# Patient Record
Sex: Female | Born: 1980 | Race: White | Hispanic: No | Marital: Married | Smoking: Never smoker
Health system: Southern US, Community
[De-identification: ages and names within clinical notes are randomized; demographics above are authoritative.]

## PROBLEM LIST (undated history)

## (undated) DIAGNOSIS — H8110 Benign paroxysmal vertigo, unspecified ear: Secondary | ICD-10-CM

## (undated) HISTORY — PX: UMBILICAL HERNIA REPAIR: SHX196

## (undated) HISTORY — DX: Benign paroxysmal vertigo, unspecified ear: H81.10

## (undated) HISTORY — PX: TONSILLECTOMY AND ADENOIDECTOMY: SUR1326

---

## 1999-08-31 ENCOUNTER — Emergency Department (HOSPITAL_COMMUNITY): Admission: EM | Admit: 1999-08-31 | Discharge: 1999-09-01 | Payer: Self-pay | Admitting: Emergency Medicine

## 2000-06-06 ENCOUNTER — Other Ambulatory Visit: Admission: RE | Admit: 2000-06-06 | Discharge: 2000-06-06 | Payer: Self-pay | Admitting: Obstetrics and Gynecology

## 2001-06-09 ENCOUNTER — Other Ambulatory Visit: Admission: RE | Admit: 2001-06-09 | Discharge: 2001-06-09 | Payer: Self-pay | Admitting: Obstetrics and Gynecology

## 2002-07-03 ENCOUNTER — Other Ambulatory Visit: Admission: RE | Admit: 2002-07-03 | Discharge: 2002-07-03 | Payer: Self-pay | Admitting: Obstetrics and Gynecology

## 2003-08-18 ENCOUNTER — Other Ambulatory Visit: Admission: RE | Admit: 2003-08-18 | Discharge: 2003-08-18 | Payer: Self-pay | Admitting: Obstetrics and Gynecology

## 2004-03-15 ENCOUNTER — Emergency Department (HOSPITAL_COMMUNITY): Admission: EM | Admit: 2004-03-15 | Discharge: 2004-03-16 | Payer: Self-pay | Admitting: Emergency Medicine

## 2004-09-12 ENCOUNTER — Other Ambulatory Visit: Admission: RE | Admit: 2004-09-12 | Discharge: 2004-09-12 | Payer: Self-pay | Admitting: Obstetrics and Gynecology

## 2004-11-10 ENCOUNTER — Ambulatory Visit: Payer: Self-pay | Admitting: Internal Medicine

## 2004-11-24 ENCOUNTER — Ambulatory Visit: Payer: Self-pay | Admitting: Internal Medicine

## 2005-12-27 ENCOUNTER — Ambulatory Visit: Payer: Self-pay | Admitting: Internal Medicine

## 2006-01-16 LAB — PULMONARY FUNCTION TEST

## 2006-01-29 ENCOUNTER — Encounter: Admission: RE | Admit: 2006-01-29 | Discharge: 2006-01-29 | Payer: Self-pay | Admitting: Allergy and Immunology

## 2006-10-23 ENCOUNTER — Telehealth (INDEPENDENT_AMBULATORY_CARE_PROVIDER_SITE_OTHER): Payer: Self-pay | Admitting: *Deleted

## 2006-10-25 ENCOUNTER — Ambulatory Visit: Payer: Self-pay | Admitting: Internal Medicine

## 2006-10-25 DIAGNOSIS — Z8611 Personal history of tuberculosis: Secondary | ICD-10-CM

## 2006-10-30 ENCOUNTER — Encounter: Payer: Self-pay | Admitting: Internal Medicine

## 2007-01-29 ENCOUNTER — Emergency Department (HOSPITAL_COMMUNITY): Admission: EM | Admit: 2007-01-29 | Discharge: 2007-01-30 | Payer: Self-pay | Admitting: Emergency Medicine

## 2007-07-04 ENCOUNTER — Ambulatory Visit (HOSPITAL_COMMUNITY): Admission: RE | Admit: 2007-07-04 | Discharge: 2007-07-04 | Payer: Self-pay | Admitting: Obstetrics and Gynecology

## 2007-09-15 ENCOUNTER — Ambulatory Visit: Payer: Self-pay | Admitting: Internal Medicine

## 2007-09-15 DIAGNOSIS — R1033 Periumbilical pain: Secondary | ICD-10-CM | POA: Insufficient documentation

## 2007-09-15 LAB — CONVERTED CEMR LAB
Beta hcg, urine, semiquantitative: NEGATIVE
Nitrite: NEGATIVE
Specific Gravity, Urine: 1.01
WBC Urine, dipstick: NEGATIVE
pH: 6

## 2007-09-18 ENCOUNTER — Ambulatory Visit: Payer: Self-pay | Admitting: Cardiology

## 2007-09-22 LAB — CONVERTED CEMR LAB
Basophils Relative: 0.3 % (ref 0.0–3.0)
Eosinophils Absolute: 0.1 10*3/uL (ref 0.0–0.7)
Eosinophils Relative: 0.7 % (ref 0.0–5.0)
MCHC: 33.9 g/dL (ref 30.0–36.0)
Monocytes Absolute: 0.5 10*3/uL (ref 0.1–1.0)
Monocytes Relative: 6.4 % (ref 3.0–12.0)
Neutro Abs: 4.8 10*3/uL (ref 1.4–7.7)
Platelets: 176 10*3/uL (ref 150–400)
RDW: 11.7 % (ref 11.5–14.6)
WBC: 7.8 10*3/uL (ref 4.5–10.5)

## 2007-09-23 ENCOUNTER — Telehealth (INDEPENDENT_AMBULATORY_CARE_PROVIDER_SITE_OTHER): Payer: Self-pay | Admitting: *Deleted

## 2007-11-11 ENCOUNTER — Telehealth: Payer: Self-pay | Admitting: Internal Medicine

## 2008-01-15 ENCOUNTER — Telehealth (INDEPENDENT_AMBULATORY_CARE_PROVIDER_SITE_OTHER): Payer: Self-pay | Admitting: *Deleted

## 2008-03-26 ENCOUNTER — Ambulatory Visit: Payer: Self-pay | Admitting: Family Medicine

## 2008-03-30 ENCOUNTER — Telehealth (INDEPENDENT_AMBULATORY_CARE_PROVIDER_SITE_OTHER): Payer: Self-pay | Admitting: *Deleted

## 2008-04-01 ENCOUNTER — Encounter: Payer: Self-pay | Admitting: Family Medicine

## 2008-09-09 ENCOUNTER — Ambulatory Visit: Payer: Self-pay | Admitting: Surgery

## 2008-09-09 ENCOUNTER — Ambulatory Visit (HOSPITAL_COMMUNITY): Admission: RE | Admit: 2008-09-09 | Discharge: 2008-09-09 | Payer: Self-pay | Admitting: Obstetrics and Gynecology

## 2008-09-09 ENCOUNTER — Encounter (HOSPITAL_COMMUNITY): Payer: Self-pay | Admitting: Obstetrics and Gynecology

## 2008-10-12 ENCOUNTER — Inpatient Hospital Stay (HOSPITAL_COMMUNITY): Admission: AD | Admit: 2008-10-12 | Discharge: 2008-10-12 | Payer: Self-pay | Admitting: Obstetrics and Gynecology

## 2008-10-13 ENCOUNTER — Inpatient Hospital Stay (HOSPITAL_COMMUNITY): Admission: AD | Admit: 2008-10-13 | Discharge: 2008-10-15 | Payer: Self-pay | Admitting: Obstetrics and Gynecology

## 2009-09-14 ENCOUNTER — Ambulatory Visit: Payer: Self-pay | Admitting: Internal Medicine

## 2009-09-14 DIAGNOSIS — R42 Dizziness and giddiness: Secondary | ICD-10-CM | POA: Insufficient documentation

## 2009-12-06 ENCOUNTER — Ambulatory Visit: Payer: Self-pay | Admitting: Internal Medicine

## 2009-12-06 DIAGNOSIS — H8110 Benign paroxysmal vertigo, unspecified ear: Secondary | ICD-10-CM

## 2009-12-20 ENCOUNTER — Encounter
Admission: RE | Admit: 2009-12-20 | Discharge: 2010-02-15 | Payer: Self-pay | Source: Home / Self Care | Attending: Internal Medicine | Admitting: Internal Medicine

## 2009-12-27 ENCOUNTER — Encounter: Payer: Self-pay | Admitting: Internal Medicine

## 2010-02-06 ENCOUNTER — Telehealth (INDEPENDENT_AMBULATORY_CARE_PROVIDER_SITE_OTHER): Payer: Self-pay | Admitting: *Deleted

## 2010-03-19 ENCOUNTER — Encounter: Payer: Self-pay | Admitting: Obstetrics and Gynecology

## 2010-03-27 ENCOUNTER — Ambulatory Visit
Admission: RE | Admit: 2010-03-27 | Discharge: 2010-03-27 | Payer: Self-pay | Source: Home / Self Care | Attending: Internal Medicine | Admitting: Internal Medicine

## 2010-03-27 DIAGNOSIS — H5500 Unspecified nystagmus: Secondary | ICD-10-CM | POA: Insufficient documentation

## 2010-03-27 DIAGNOSIS — K219 Gastro-esophageal reflux disease without esophagitis: Secondary | ICD-10-CM | POA: Insufficient documentation

## 2010-03-28 NOTE — Miscellaneous (Signed)
Summary: PT Initial Summary/[facility]  PT Initial [facility]   Imported By: Lanelle Bal 01/05/2010 14:02:32  _____________________________________________________________________  External Attachment:    Type:   Image     Comment:   External Document

## 2010-03-28 NOTE — Assessment & Plan Note (Signed)
Summary: VERTIGO IN JULY, THEN FLARED LAST WEEK///SPH   Vital Signs:  Patient profile:   30 year old female Height:      66 inches Weight:      148.8 pounds BMI:     24.10 Temp:     98.4 degrees F oral Pulse rate:   72 / minute Resp:     14 per minute BP sitting:   110 / 68  (left arm) Cuff size:   regular  Vitals Entered By: Shonna Chock CMA (December 06, 2009 4:12 PM) CC: Re-occuring vertigo, Syncope   CC:  Re-occuring vertigo and Syncope.  History of Present Illness:      This is a 30 year old woman who presents with dizziness &  frank vertigo in 08/2009 for 3-4 weeks & now  from 10/02 - now .  The patient denies loss of consciousness, premonitory symptoms, near loss of consciousness, palpitations, and lightheadedness.  The patient denies the following symptoms: headache, abdominal discomfort, nausea, vomiting, pallor, diaphoresis, focal weakness, blurred vision, and perioral numbness.  The patient reports the following precipitating factors: change in position in bed , not definitely either LDP.  The  verigo always has occurred in the setting of changing position in bed. She went to Web [employer] ; exercises did not help.   Allergies (verified): No Known Drug Allergies  Review of Systems ENT:  Denies decreased hearing, nasal congestion, and ringing in ears.  Physical Exam  General:  well-nourished,in no acute distress; alert,appropriate and cooperative throughout examination Eyes:  No corneal or conjunctival inflammation noted. EOMI. Perrla. Field of  Vision grossly normal. Minimal L lateral gaze nystagmus; no vertigo with positioning Ears:  External ear exam shows no significant lesions or deformities.  Otoscopic examination reveals clear canals, tympanic membranes are intact bilaterally without bulging, retraction, inflammation or discharge. Hearing is grossly normal bilaterally.Tuning fork exam WNL Mouth:  Oral mucosa and oropharynx without lesions or exudates.  Teeth in good repair  No tongue deviation. Neurologic:  alert & oriented X3, cranial nerves II-XII intact, sensation intact to light touch, gait normal, DTRs symmetrical and normal, finger-to-nose normal, and Romberg negative.   Skin:  Intact without suspicious lesions or rashes Cervical Nodes:  No lymphadenopathy noted Axillary Nodes:  No palpable lymphadenopathy Psych:  memory intact for recent and remote, normally interactive, and good eye contact.     Impression & Recommendations:  Problem # 1:  BENIGN POSITIONAL VERTIGO (ICD-386.11)  Orders: Physical Therapy Referral (PT)  Her updated medication list for this problem includes:    Meclizine Hcl 25 Mg Tabs (Meclizine hcl) .Marland Kitchen... 1 every 6 hrs as needed for vertigo  Complete Medication List: 1)  Meclizine Hcl 25 Mg Tabs (Meclizine hcl) .Marland Kitchen.. 1 every 6 hrs as needed for vertigo  Patient Instructions: 1)  Limit your Sodium (Salt) to less than 4 grams a day (slightly less than 1 teaspoon) to prevent fluid retention, swelling, or worsening or symptoms. Prescriptions: MECLIZINE HCL 25 MG TABS (MECLIZINE HCL) 1 every 6 hrs as needed for vertigo  #30 x 1   Entered and Authorized by:   Marga Melnick MD   Signed by:   Marga Melnick MD on 12/06/2009   Method used:   Print then Give to Patient   RxID:   [id number]

## 2010-03-28 NOTE — Assessment & Plan Note (Signed)
Summary: ear infection/cbs   Vital Signs:  Patient profile:   30 year old female Weight:      144.2 pounds Temp:     98.7 degrees F oral Pulse rate:   76 / minute Resp:     14 per minute BP sitting:   124 / 80  (left arm) Cuff size:   regular  Vitals Entered By: Shonna Chock CMA (September 14, 2009 12:40 PM) CC: Ear cocnerns x 2 weeks (right is worse): Patient was seen at [facility] last Friday and they said possible fluid behind ears but didnt rx anything because patient is still nursing   CC:  Ear cocnerns x 2 weeks (right is worse): Patient was seen at [facility] last Friday and they said possible fluid behind ears but didnt rx anything because patient is still nursing.  History of Present Illness: Onset 2 weeks ago as some imbalance as  previously noted on cruise. ? Serous otitis  & Eustachian tube dysfunction  diagnosed @ [facility];but no meds  due to nursing. ? nystagmus documented.  Current Medications (verified): 1)  None  Allergies: No Known Drug Allergies  Review of Systems General:  Denies chills, fever, and sweats. Eyes:  Denies blurring, double vision, and vision loss-both eyes. ENT:  Complains of sinus pressure; denies ear discharge, earache, and nasal congestion; No frontal headache, facial pain or purulence. Neuro:  Denies sensation of room spinning; No BPV.  Physical Exam  General:  well-nourished,in no acute distress; alert,appropriate and cooperative throughout examination Eyes:  No corneal or conjunctival inflammation noted. EOMI but minimally decreased OS accommodation. Perrla. Field of  Vision grossly normal. Very minimal nystagmus with L lateral gaze Ears:  External ear exam shows no significant lesions or deformities.  Otoscopic examination reveals clear canals, tympanic membranes are intact bilaterally without bulging, retraction, inflammation or discharge. Hearing is grossly normal bilaterally.? minimal serous changes L TM Nose:  External nasal examination shows no deformity or  inflammation. Nasal mucosa are pink and moist without lesions or exudates. Mouth:  Oral mucosa and oropharynx without lesions or exudates.  Teeth in good repair. Neurologic:  alert & oriented X3, cranial nerves II-XII intact, strength normal in all extremities, sensation intact to light touch, gait normal, DTRs symmetrical and normal, finger-to-nose normal, and Romberg negative.   Skin:  Intact without suspicious lesions or rashes Cervical Nodes:  Shotty on L Axillary Nodes:  No palpable lymphadenopathy   Impression & Recommendations:  Problem # 1:  DIZZINESS (ICD-780.4) persistant sense of imbalance  Complete Medication List: 1)  Prednisone 20 Mg Tabs (Prednisone) .... 1/2 three times a day with meals  Patient Instructions: 1)  Go to WebMD for Eustachian Tube Dyfunction .Gentle Valsalva maneuvers as needed for ear pressure.Drink as much  non dairy fluid as you can tolerate for the next few days.Limit your Sodium (Salt) to less than 4 grams a day (slightly less than 1 teaspoon) to prevent fluid retention, swelling, or worsening or symptoms. Prescriptions: PREDNISONE 20 MG TABS (PREDNISONE) 1/2 three times a day with meals  #9 x 0   Entered and Authorized by:   Marga Melnick MD   Signed by:   Marga Melnick MD on 09/14/2009   Method used:   Faxed to ...       [facility] [address] [phone]* (retail)       [address]       [place], [place]  [address]  Ph: [phone]       Fax: [phone]   RxID:   [id number]

## 2010-03-30 NOTE — Progress Notes (Signed)
Summary: blood in stool  Phone Note Call from Patient Call back at [facility] Phone [phone]   Caller: Patient Summary of Call: Pt c/o bright red blood in stool with BM x1weeks on and off. Pt would like a return call..............Marland KitchenFelecia Deloach CMA  February 06, 2010 1:12 PM   Pt states that it is only a little blood in stools after BM. Pt denies any abdominal pain, swelling or tenderness, straining, constipation......Marland KitchenFelecia Deloach CMA  February 06, 2010 1:28 PM    Follow-up for Phone Call        Per Dr.Hopper: 1.) Sitz baths two times a day then 2.) Proctofoam HC 3.) Stool softner OTC and stay well hydrated, Office visit if no better   Patient aware of Dr.Hopper's instruction Follow-up by: Shonna Chock CMA,  February 06, 2010 2:16 PM    New/Updated Medications: PROCTOFOAM HC 1-1 % FOAM (HYDROCORTISONE ACE-PRAMOXINE) apply 3-4 x daily Prescriptions: PROCTOFOAM HC 1-1 % FOAM (HYDROCORTISONE ACE-PRAMOXINE) apply 3-4 x daily  #10g x 1   Entered by:   Shonna Chock CMA   Authorized by:   Marga Melnick MD   Signed by:   Shonna Chock CMA on 02/06/2010   Method used:   Electronically to        [id number]* (retail)       [address]       [place], [place]  [address]       Ph: [phone]       Fax: [phone]   RxID:   [id number]

## 2010-04-04 ENCOUNTER — Telehealth: Payer: Self-pay | Admitting: Internal Medicine

## 2010-04-05 NOTE — Assessment & Plan Note (Signed)
Summary: talk about vertigo, eyes are "jittery"///sph   Vital Signs:  Patient profile:   30 year old female Weight:      146.2 pounds BMI:     23.68 O2 Sat:      98 % Temp:     98.4 degrees F oral Pulse rate:   86 / minute Resp:     12 per minute BP sitting:   116 / 64  (left arm) Cuff size:   regular  Vitals Entered By: Shonna Chock CMA (March 27, 2010 4:29 PM) CC: Jittery eyes/ bothers patient's concentration when driving (patient does NOT wear glasses or contacts). Patient also questions if jittery eyes related to acid reflux   CC:  Jittery eyes/ bothers patient's concentration when driving (patient does NOT wear glasses or contacts). Patient also questions if jittery eyes related to acid reflux.  History of Present Illness:    BPV symptoms have resolved ; she was taught preventive maneuvers by [facility]. She has had intermittent nystagmus while driving & while @ rest while  sitting. Last Ophth exam @ age 1.  Current Medications (verified): 1)  Meclizine Hcl 25 Mg Tabs (Meclizine Hcl) .Marland Kitchen.. 1 Every 6 Hrs As Needed For Vertigo  Allergies (verified): No Known Drug Allergies  Review of Systems Eyes:  Denies blurring, double vision, and vision loss-both eyes. ENT:  Denies decreased hearing, ear discharge, earache, nasal congestion, and sinus pressure; Tinnitus 01/28 on R. GI:  Denies abdominal pain, bloody stools, and dark tarry stools; NP cough & throat clearing improves with PPI.  Physical Exam  General:  well-nourished,in no acute distress; alert,appropriate and cooperative throughout examination Eyes:  No corneal or conjunctival inflammation noted. EOMI. Perrla.Unsustained nystagmus  with  R & L  lateral gaze Ears:  External ear exam shows no significant lesions or deformities.  Otoscopic examination reveals clear canals, tympanic membranes are intact bilaterally without bulging, retraction, inflammation or discharge. Hearing is grossly normal bilaterally.Rinne  normal (BC>AC) and Weber normal.   Pulses:  R and L carotid pulses are full and equal bilaterally Neurologic:  alert & oriented X3, strength normal in all extremities, gait normal, DTRs symmetrical and normal, finger-to-nose normal, and Romberg negative.     Impression & Recommendations:  Problem # 1:  NYSTAGMUS (ICD-379.50)  Orders: Ophthalmology Referral (Ophthalmology)  Problem # 2:  GERD (ICD-530.81)  Her updated medication list for this problem includes:    Omeprazole 20 Mg Cpdr (Omeprazole) .Marland Kitchen... 1 pill 30 min pre b'fast  Complete Medication List: 1)  Meclizine Hcl 25 Mg Tabs (Meclizine hcl) .Marland Kitchen.. 1 every 6 hrs as needed for vertigo 2)  Omeprazole 20 Mg Cpdr (Omeprazole) .Marland Kitchen.. 1 pill 30 min pre b'fast  Patient Instructions: 1)  Avoid foods high in acid (tomatoes, citrus juices, spicy foods). Avoid eating within two hours of lying down or before exercising. Do not over eat; try smaller more frequent meals. Elevate head of bed twelve inches when sleeping. ENT referral if Ophthalmology evaluation. Prescriptions: OMEPRAZOLE 20 MG CPDR (OMEPRAZOLE) 1 pill 30 min pre b'fast  #30 x 5   Entered and Authorized by:   Marga Melnick MD   Signed by:   Marga Melnick MD on 03/27/2010   Method used:   Electronically to        [facility] [phone]* (retail)       [address]       [place], [place]  [address]  Ph: [phone]       Fax: [phone]   RxID:   [id number]    Orders Added: 1)  Est. Patient Level III [41324] 2)  Ophthalmology Referral [Ophthalmology]

## 2010-04-13 NOTE — Progress Notes (Signed)
Summary: Eye Exam  Phone Note Call from Patient Call back at Home Phone [phone]   Caller: Patient Summary of Call: Patient called to let us know how her eye appt with Dr. Karleen [facility] went. The eye exam came back normal. She also wanted to know that when she say the [facility] (Dr. Karleen [facility]), they weren't taking her seriously. She felt that since her eyes weren't flaring up and they didnt think she was serious about the situation. She said he ran a lot of tests but her eye were not "jittering" back and forth at that time. Dr. Karleen [facility] asked her mutiple times if anyone else had seen her eyes do this and she felt like he did not believe her and thought she was lying. She would like to know what the next step will be.  Initial call taken by: Harold Barban,  April 04, 2010 1:59 PM  Follow-up for Phone Call        Neuro eval Follow-up by: Marga Melnick MD,  April 05, 2010 5:12 AM

## 2010-06-03 LAB — CBC
HCT: 30.7 % — ABNORMAL LOW (ref 36.0–46.0)
MCV: 95.2 fL (ref 78.0–100.0)
Platelets: 117 10*3/uL — ABNORMAL LOW (ref 150–400)
RBC: 3.23 MIL/uL — ABNORMAL LOW (ref 3.87–5.11)
WBC: 10.8 10*3/uL — ABNORMAL HIGH (ref 4.0–10.5)

## 2010-06-03 LAB — RPR: RPR Ser Ql: NONREACTIVE

## 2010-07-11 NOTE — H&P (Signed)
Cheryl Hanson, Cheryl Hanson            ACCOUNT NO.:  [id number]   MEDICAL RECORD NO.:  [id number]          PATIENT TYPE:  INP   LOCATION:  9174                          FACILITY:  [facility]   PHYSICIAN:  Guy Sandifer. Henderson Cloud, M.D. DATE OF BIRTH:  Apr 16, 1980   DATE OF ADMISSION:  10/13/2008  DATE OF DISCHARGE:                              HISTORY & PHYSICAL   CHIEF COMPLAINT:  Labor.   HISTORY OF PRESENT ILLNESS:  The patient is a 30 year old married white  female G2, P0, with an EDC of October 18, 2008, consistent with a 7-2/7  weeks ultrasound, placing her at 39-2/7 weeks.  Was evaluated in  maternity admissions last evening and was 2 cm.  Complains of continued  contractions and bloody show.  On exam in the office she is 4, complete,  0 station, having regular uterine contractions.  Denies rupture of  membranes, central nervous system changes, epigastric pain, fever or  nausea or vomiting.   PAST MEDICAL AND SURGICAL HISTORY, FAMILY HISTORY, OBSTETRIC HISTORY,  SOCIAL HISTORY:  See prenatal history and physical.   MEDICATIONS:  Prenatal vitamins.   ALLERGIES:  NO KNOWN DRUG ALLERGIES.   PHYSICAL EXAM:  VITAL SIGNS:  Height 5 feet 6 inches, weight 184 pounds,  blood pressure 112/76.  LUNGS:  Clear to auscultation.  HEART: Regular rate and rhythm.  ABDOMEN:  Gravid without epigastric tenderness.  CERVIX:  4, complete, 0 station.   LABS:  Group B strep is positive.   ASSESSMENT:  Active labor.   PLAN:  Admit to [facility].  Begin IV antibiotics per  group B strep protocol.      Guy Sandifer Henderson Cloud, M.D.  Electronically Signed     JET/MEDQ  D:  10/13/2008  T:  10/13/2008  Job:  [id number]

## 2010-12-04 LAB — BASIC METABOLIC PANEL
Chloride: 102
Creatinine, Ser: 0.85
GFR calc Af Amer: >60
Potassium: 3.6

## 2010-12-04 LAB — DIFFERENTIAL
Basophils Absolute: 0
Eosinophils Absolute: 0 — ABNORMAL LOW
Lymphocytes Relative: 4 — ABNORMAL LOW
Lymphs Abs: 0.6 — ABNORMAL LOW

## 2010-12-04 LAB — POCT PREGNANCY, URINE: Operator id: 29727

## 2010-12-04 LAB — CBC
Hemoglobin: 14.2
MCHC: 34.9
MCV: 86.1
Platelets: 181
RBC: 4.74

## 2010-12-04 LAB — INFLUENZA A+B VIRUS AG-DIRECT(RAPID): Influenza B Ag: NEGATIVE

## 2010-12-04 LAB — URINALYSIS, ROUTINE W REFLEX MICROSCOPIC
Hgb urine dipstick: NEGATIVE
Nitrite: NEGATIVE
pH: 7

## 2011-05-10 ENCOUNTER — Encounter: Payer: Self-pay | Admitting: Internal Medicine

## 2011-05-10 ENCOUNTER — Ambulatory Visit (INDEPENDENT_AMBULATORY_CARE_PROVIDER_SITE_OTHER): Payer: BC Managed Care – PPO | Admitting: Internal Medicine

## 2011-05-10 VITALS — BP 122/84 | HR 89 | Temp 98.8°F | Wt 142.8 lb

## 2011-05-10 DIAGNOSIS — J209 Acute bronchitis, unspecified: Secondary | ICD-10-CM

## 2011-05-10 MED ORDER — AZITHROMYCIN 250 MG PO TABS: ORAL_TABLET | ORAL | Status: AC

## 2011-05-10 MED ORDER — HYDROCODONE-HOMATROPINE 5-1.5 MG/5ML PO SYRP: 5.0000 mL | ORAL_SOLUTION | Freq: Four times a day (QID) | ORAL | Status: AC | PRN

## 2011-05-10 NOTE — Patient Instructions (Signed)
Plain Mucinex for thick secretions ;force NON dairy fluids . Use a Neti pot daily as needed for sinus congestion. Nasal cleansing in the shower as discussed. Make sure that all residual soap is removed to prevent irritation.  

## 2011-05-10 NOTE — Progress Notes (Signed)
  Subjective:    Patient ID: Cheryl Hanson, female    DOB: Jan 07, 1981, 31 y.o.   MRN: [id number]  HPI CC: URI, cough, back pain from cough (back hurts all over, hurts to move), cough prevents sleeping   HPI: Onset/first symptoms: dry cough since monday (3 days ago) Trigger/exposures: no Progression of symptoms: dry cough, back pain from coughing so much (no myalgias or arthralgias), runny nose (clear drainage) Treatment and Response: Vicks  cough and Advil - not helping Present symptoms: Fever/chills/seats: no Frontal headache: no Facial pain:no Nasal purulence:no Sore throat: yes 4/10 Dental pain: no Lymphadenopathy:no Wheezing/Shortness of breath: no Cough/sputum/hemoptysis: no no sneezing, watery eyes, or itchiness  Review of Systems PMH (ex: seasonal allergies, asthma, smoking etc.): no to all   Medications: no ROS: Abd pain/bowel changes: no  ?       Objective:  Physical Exam  Physical Exam: General: Patient appears healthy and well-nourished, however pt appears fatigued; Patient coughs intermittently; Patient is in no acute distress; Patient sounds congested. Neuro: Alert and oriented X 4 Lymph: No lymphadenopathy about the neck or axilla. HEENT: No scleral icterus. No corneal or conjunctivalinflammation noted. Extra ocular motion intact. External ear exam reveals no significant lesions or deformities, ear canals clear, TMs pearly grey without bulging, retraction, inflammation, or discharge, hearing is grossly normalbilaterally from 5 ft. External nasal exam reveals no deformity of inflammation. Nasal mucosa are pink and moist. No lesions or exudates noted with no obstruction to airflow. Oral mucosa and oropharynx reveal no lesions or exudates. However, oropharynx reveals erythema. Teeth in good repair. Cardiovascular: Heart rhythm and rate are regular with no significant murmur, rubs, or gallops. Respiratory: Chest and lungs are clear to auscultation with no  increased work of breathing, rhonchi, rales, or wheezing. Chest expands symmetrically. Abd: Abdsoft, non-tender to palpation, reveals no organomegaly or masses. Circulatory: No clubbing or edema noted. Radial and pedal pulses intact, 2 + bilaterally. No clubbing, cyanosis, or edema noted of extremities.   Integumentary: Skin warm and dry. No ischemic skin changes are present.      Assessment & Plan:  Acute bronchitis with injected pharynx w/o exudate, ant LA , or  without bronchospasm  NSAID (ibuprofen) for MS pain post cough, cough medication with codeine?, nasal spray (flonase) if nasal congestion fails to respond to  Neti pot & nasal cleansing Zpack

## 2011-11-02 ENCOUNTER — Encounter: Payer: Self-pay | Admitting: Internal Medicine

## 2011-11-02 ENCOUNTER — Ambulatory Visit (INDEPENDENT_AMBULATORY_CARE_PROVIDER_SITE_OTHER): Payer: BC Managed Care – PPO | Admitting: Internal Medicine

## 2011-11-02 VITALS — BP 122/78 | HR 100 | Temp 98.2°F | Wt 136.8 lb

## 2011-11-02 DIAGNOSIS — J029 Acute pharyngitis, unspecified: Secondary | ICD-10-CM

## 2011-11-02 DIAGNOSIS — R509 Fever, unspecified: Secondary | ICD-10-CM

## 2011-11-02 DIAGNOSIS — M791 Myalgia, unspecified site: Secondary | ICD-10-CM

## 2011-11-02 DIAGNOSIS — IMO0001 Reserved for inherently not codable concepts without codable children: Secondary | ICD-10-CM

## 2011-11-02 LAB — CBC WITH DIFFERENTIAL/PLATELET
Basophils Absolute: 0 10*3/uL (ref 0.0–0.1)
Eosinophils Relative: 0 % (ref 0–5)
HCT: 39.7 % (ref 36.0–46.0)
Hemoglobin: 13.8 g/dL (ref 12.0–15.0)
MCH: 29.6 pg (ref 26.0–34.0)
MCV: 85.2 fL (ref 78.0–100.0)
Monocytes Absolute: 0.7 10*3/uL (ref 0.1–1.0)
Monocytes Relative: 8 % (ref 3–12)
Neutro Abs: 7.1 10*3/uL (ref 1.7–7.7)
RBC: 4.66 MIL/uL (ref 3.87–5.11)
WBC: 9.3 10*3/uL (ref 4.0–10.5)

## 2011-11-02 MED ORDER — AZITHROMYCIN 250 MG PO TABS: ORAL_TABLET | ORAL | Status: AC

## 2011-11-02 NOTE — Patient Instructions (Addendum)
Plain Mucinex for thick secretions ;force NON dairy fluids . Use a Neti pot daily as needed for sinus congestion; going from open side to congested side . Nasal cleansing in the shower as discussed. Make sure that all residual soap is removed to prevent irritation. Fluticasone 1 spray in each nostril twice a day as needed. Use the "crossover" technique as discussed. Plain Allegra 160 daily as needed for itchy eyes & sneezing. Zicam Melts or Zinc lozenges for sore throat. Report fever, exudate("pus") or progressive pain. NSAIDS ( Aleve, Advil, Naproxen) or Tylenol every 4 hrs as needed for fever as discussed based on label recommendations.  If you activate My Chart; the results can be released to you as soon as they populate from the lab. If you choose not to use this program; the labs have to be reviewed, copied & mailed   causing a delay in getting the results to you.

## 2011-11-02 NOTE — Progress Notes (Signed)
  Subjective:    Patient ID: Cheryl Hanson, female    DOB: [birth year], 31 y.o.   MRN: [id number]  HPI 3 weeks ago she was seen at a [facility] for URI symptoms and found to have a positive rapid strep; she received 10 days of amoxicillin. Last week the sore throat recurred; repeat rapid strep was negative.  During this period her 1-year-old son has had strep followed by otitis x2.  She does have some rhinitis without obstruction. She's had an intermittent nonproductive cough. As of yesterday she is experiencing sore throat, nausea, chills, fever to 100.1, & general generalized arthralgias and myalgias.   Review of Systems She denies nasal congestion/obstruction; nasal purulence; facial pain; anosmia; frontal headache; halitosis; earache and dental pain. .      Objective:   Physical Exam General appearance:good health ;well nourished; no acute distress or increased work of breathing is present but she appears fatigues.  No  lymphadenopathy about the head, neck, or axilla noted.   Eyes: No conjunctival inflammation or lid edema is present.   Ears:  External ear exam shows no significant lesions or deformities.  Otoscopic examination reveals clear canals, tympanic membranes are intact bilaterally without bulging, retraction, inflammation or discharge.  Nose:  External nasal examination shows no deformity or inflammation. Nasal mucosa are pink and moist without lesions or exudates. No septal dislocation or deviation.No obstruction to airflow.   Oral exam: Dental hygiene is good; lips and gums are healthy appearing.There is R > L oropharyngeal erythema or exudate noted.   Neck:  No deformities, thyromegaly, masses, or tenderness noted.   Supple with full range of motion without pain.   Heart:  Normal rate and regular rhythm. S1 and S2 normal without gallop, murmur, click, rub or other extra sounds.   Lungs:Chest clear to auscultation; no wheezes, rhonchi,rales ,or rubs present.No  increased work of breathing. Dry cough    Bowel sounds are normal. Abdomen is soft and nontender with no organomegaly, hernias  or masses.   Extremities:  No cyanosis, edema, or clubbing  noted . Negative straight leg raising   Skin: Warm & dry .          Assessment & Plan:  #1 status post strep throat 3 weeks ago followed by recurrent pharyngitis with fever, chills, myalgias and arthralgias. This is in the context of exposure to her 74 year old son who has had strep and otitis .  Plan: See orders &  recommendations

## 2012-05-13 ENCOUNTER — Encounter: Payer: Self-pay | Admitting: Internal Medicine

## 2012-05-13 ENCOUNTER — Ambulatory Visit (INDEPENDENT_AMBULATORY_CARE_PROVIDER_SITE_OTHER): Payer: BC Managed Care – PPO | Admitting: Internal Medicine

## 2012-05-13 VITALS — BP 118/72 | HR 92 | Temp 98.0°F | Resp 12 | Wt 147.0 lb

## 2012-05-13 DIAGNOSIS — J209 Acute bronchitis, unspecified: Secondary | ICD-10-CM

## 2012-05-13 DIAGNOSIS — J069 Acute upper respiratory infection, unspecified: Secondary | ICD-10-CM

## 2012-05-13 MED ORDER — AZITHROMYCIN 250 MG PO TABS: ORAL_TABLET | ORAL | Status: DC

## 2012-05-13 MED ORDER — FLUTICASONE PROPIONATE 50 MCG/ACT NA SUSP: 1.0000 | Freq: Two times a day (BID) | NASAL | Status: DC | PRN

## 2012-05-13 MED ORDER — HYDROCODONE-HOMATROPINE 5-1.5 MG/5ML PO SYRP: 5.0000 mL | ORAL_SOLUTION | Freq: Four times a day (QID) | ORAL | Status: DC | PRN

## 2012-05-13 NOTE — Progress Notes (Signed)
  Subjective:    Patient ID: Cheryl Hanson, female    DOB: 09/11/1980, 32 y.o.   MRN: [id number]  HPI The respiratory tract symptoms began 3 weeks ago malaise & head congestion. Exposures reported  to sick work associates. Significant active  associated symptoms include post drainage ,sore throat & dry cough. No wheezing but some dyspnea reported.   Extrinsic symptoms of  sneezing initially present .   Myalgias and arthralgias were not present. Headache and back pain were related to her cough. Flu shot not current  Treatment with OTC allergy med,   Tylenol,&  Mucinex was partially effective. There is no history of asthma ,  seasonal or perennial allergies.  The patient had never smoked                     Review of Systems  She denies fever, chills, or sweats. She did not have significant watery eyes or itching eyes. She also denies frontal headache, facial pain, dental pain, nasal purulence, otic pain, or otic discharge.     Objective:   Physical Exam  General appearance:good health ;well nourished; no acute distress or increased work of breathing is present.   Eyes: No conjunctival inflammation or lid edema is present Ears:  External ear exam shows no significant lesions or deformities.  Otoscopic examination reveals clear canals, tympanic membranes are intact bilaterally without bulging, retraction, inflammation or discharge. Nose:  External nasal examination shows no deformity or inflammation. Nasal mucosa are dry without lesions or exudates. No septal dislocation or deviation.No obstruction to airflow.  Oral exam: Dental hygiene is good; lips and gums are healthy appearing.There is no oropharyngeal erythema or exudate noted.  Neck:  No deformities, masses, or tenderness noted.   Supple with full range of motion without pain.  Heart:  Normal rate and regular rhythm. S1 and S2 normal without gallop, click, rub or murmur.  Lungs:Chest clear to auscultation; no  wheezes, rhonchi,rales ,or rubs present.No increased work of breathing.   Extremities:  No cyanosis, edema, or clubbing  noted  No  lymphadenopathy about the head, neck, or axilla noted.  Skin: Warm & dry         Assessment & Plan:  #1 acute bronchitis w/o bronchospasm #2 URI, acute Plan: See orders and recommendations

## 2012-05-13 NOTE — Patient Instructions (Addendum)

## 2012-06-04 ENCOUNTER — Ambulatory Visit (INDEPENDENT_AMBULATORY_CARE_PROVIDER_SITE_OTHER): Payer: BC Managed Care – PPO | Admitting: Internal Medicine

## 2012-06-04 ENCOUNTER — Encounter: Payer: Self-pay | Admitting: Internal Medicine

## 2012-06-04 VITALS — BP 118/72 | HR 87 | Temp 98.1°F | Wt 146.2 lb

## 2012-06-04 DIAGNOSIS — Z8611 Personal history of tuberculosis: Secondary | ICD-10-CM

## 2012-06-04 DIAGNOSIS — J029 Acute pharyngitis, unspecified: Secondary | ICD-10-CM

## 2012-06-04 DIAGNOSIS — R05 Cough: Secondary | ICD-10-CM

## 2012-06-04 MED ORDER — OMEPRAZOLE MAGNESIUM 20 MG PO TBEC: DELAYED_RELEASE_TABLET | ORAL | Status: DC

## 2012-06-04 MED ORDER — FLUTICASONE-SALMETEROL 250-50 MCG/DOSE IN AEPB: 1.0000 | INHALATION_SPRAY | Freq: Two times a day (BID) | RESPIRATORY_TRACT | Status: DC

## 2012-06-04 NOTE — Progress Notes (Signed)
  Subjective:    Patient ID: Cheryl Hanson, female    DOB: [birth year], 32 y.o.   MRN: [id number]  HPI She has had a paroxysmal cough for @ least 6 years; it flared recently in the context of possible bronchitis. It is mainly a dry cough; even with Mucinex she is not had significant sputum production.  There is no history of asthma. She has never smoked.She is not on an ACE-I.  Her father did have throat cancer and is a nonsmoker    Review of Systems She specifically denies itchy, watery eyes or sneezing. She does not have signs of rhinosinusitis such as frontal headache, facial pain, nasal purulence, otic pain, otic discharge.  No fever , chills , or sweats.  The cough is not associated with shortness of breath or wheezing  She denies significant reflux symptoms such as dyspepsia or dysphagia; but Nexium helped the cough 4 years ago.     Objective:   Physical Exam General appearance:good health ;well nourished; no acute distress or increased work of breathing is present.  No  lymphadenopathy about the head, neck, or axilla noted.   Eyes: No conjunctival inflammation or lid edema is present.   Ears:  External ear exam shows no significant lesions or deformities.  Otoscopic examination reveals clear canals, tympanic membranes are intact bilaterally without bulging, retraction, inflammation or discharge.  Nose:  External nasal examination shows no deformity or inflammation. Nasal mucosa are pink and moist without lesions or exudates. No septal dislocation or deviation.No obstruction to airflow.   Oral exam: Dental hygiene is good; lips and gums are healthy appearing.There is no oropharyngeal erythema or exudate noted.   Neck:  No deformities, masses, or tenderness noted.    Heart:  Normal rate and regular rhythm. S1 and S2 normal without gallop, murmur, click, rub or other extra sounds.   Lungs:Chest clear to auscultation; no wheezes, rhonchi,rales ,or rubs present.No increased  work of breathing.  Paroxysmal dry cough  Extremities:  No cyanosis, edema, or clubbing  noted    Skin: Warm & dry          Assessment & Plan:  #1 recurrent paroxysmal cough without specific trigger. Her history suggest subclinical reflux. She denies extrinsic or infectious symptoms. She is a nonsmoker and has no history of asthma.  Recommendations: protein pump inhibitor twice a day  recommended with antireflux measures. This should be continued for 6-8 weeks.  If the symptoms persist or progress; primary function test would be indicated to rule out subclinical asthma.

## 2012-06-04 NOTE — Patient Instructions (Addendum)
Order for x-rays entered into  the computer; these will be performed at [address] [facility]. across from [facility]. No appointment is necessary.Reflux of gastric acid may be asymptomatic as this may occur mainly during sleep.The triggers for reflux  include stress; the "aspirin family" ; alcohol; peppermint; and caffeine (coffee, tea, cola, and chocolate). The aspirin family would include aspirin and the nonsteroidal agents such as ibuprofen &  Naproxen. Tylenol would not cause reflux. If having symptoms ; food & drink should be avoided for @ least 2 hours before going to bed.  Advair inhaler 1 puff every 12 hours  for cough . Gargle & spit after use.

## 2012-06-06 ENCOUNTER — Ambulatory Visit: Payer: BC Managed Care – PPO | Admitting: Internal Medicine

## 2012-11-26 ENCOUNTER — Encounter: Payer: Self-pay | Admitting: Internal Medicine

## 2012-11-26 ENCOUNTER — Ambulatory Visit (INDEPENDENT_AMBULATORY_CARE_PROVIDER_SITE_OTHER): Payer: BC Managed Care – PPO | Admitting: Internal Medicine

## 2012-11-26 VITALS — BP 114/73 | HR 76 | Temp 97.9°F | Resp 16 | Wt 149.0 lb

## 2012-11-26 DIAGNOSIS — J310 Chronic rhinitis: Secondary | ICD-10-CM

## 2012-11-26 DIAGNOSIS — J069 Acute upper respiratory infection, unspecified: Secondary | ICD-10-CM

## 2012-11-26 DIAGNOSIS — R05 Cough: Secondary | ICD-10-CM

## 2012-11-26 MED ORDER — AMOXICILLIN 500 MG PO CAPS: 500.0000 mg | ORAL_CAPSULE | Freq: Three times a day (TID) | ORAL | Status: DC

## 2012-11-26 MED ORDER — HYDROCODONE-HOMATROPINE 5-1.5 MG/5ML PO SYRP: 5.0000 mL | ORAL_SOLUTION | Freq: Four times a day (QID) | ORAL | Status: DC | PRN

## 2012-11-26 NOTE — Patient Instructions (Addendum)
The nasal cleansing should be daily control the rhinitis has resolved. Use only the lather, not straight shampoo.

## 2012-11-26 NOTE — Progress Notes (Signed)
  Subjective:    Patient ID: Cheryl Hanson, female    DOB: 02-05-1981, 32 y.o.   MRN: [id number]  HPI  Symptoms began 11/19/12 as a constellation of sore throat, sensitivity in her ears, rhinitis, and sneezing.  Initially she improved but then symptoms progressed. She now has head congestion; nonproductive cough, soreness in her neck laterally, and significant fatigue.  She's been taking Emerge C and an over-the-counter allergy medicine with only partial response.  She has continued to use her fluticasone.    Review of Systems  She does not have associated itchy, watery eyes.  There's been no fever, chills, or sweats  She has no frontal headache, facial pain, nasal purulence, dental pain, otic pain, otic discharge  There's been no associated shortness of breath or wheezing with the cough.     Objective:   Physical Exam General appearance:good health ;well nourished; no acute distress or increased work of breathing is present.  Minor L cervical   lymphadenopathy ; none noted in axilla    Eyes: No conjunctival inflammation or lid edema is present. .  Ears:  External ear exam shows no significant lesions or deformities.  Otoscopic examination reveals clear canals, tympanic membranes are intact bilaterally without bulging, retraction,  or discharge. There is mild erythema of the left tympanic membrane with enhancement of  light reflex.  Nose:  External nasal examination shows no deformity or inflammation. Nasal mucosa are pink and moist without lesions or exudates. No septal dislocation or deviation.No obstruction to airflow.   Oral exam: Dental hygiene is good; lips and gums are healthy appearing.There is no oropharyngeal erythema or exudate noted.   Neck:  No deformities, thyromegaly, masses, or tenderness noted.   Supple with full range of motion without pain.   Heart:  Normal rate and regular rhythm. S1 and S2 normal without gallop, murmur, click, rub or other extra sounds.    Lungs:Chest clear to auscultation; no wheezes, rhonchi,rales ,or rubs present.No increased work of breathing. Intermittent dry cough   Extremities:  No cyanosis, edema, or clubbing  noted    Skin: Warm & dry          Assessment & Plan:  #1 acute upper respiratory tract infection with significant rhinitis and associated cough  #2 erythema left TM with some "sensitivity"  Plan :see orders

## 2013-01-01 ENCOUNTER — Other Ambulatory Visit: Payer: Self-pay

## 2013-06-09 ENCOUNTER — Other Ambulatory Visit: Payer: Self-pay | Admitting: Internal Medicine

## 2013-11-04 ENCOUNTER — Other Ambulatory Visit: Payer: Self-pay

## 2013-11-04 ENCOUNTER — Ambulatory Visit (INDEPENDENT_AMBULATORY_CARE_PROVIDER_SITE_OTHER): Payer: BC Managed Care – PPO | Admitting: Internal Medicine

## 2013-11-04 ENCOUNTER — Encounter: Payer: Self-pay | Admitting: Internal Medicine

## 2013-11-04 VITALS — BP 110/66 | HR 61 | Temp 98.3°F | Ht 66.0 in | Wt 140.4 lb

## 2013-11-04 DIAGNOSIS — Z8611 Personal history of tuberculosis: Secondary | ICD-10-CM

## 2013-11-04 DIAGNOSIS — Z0000 Encounter for general adult medical examination without abnormal findings: Secondary | ICD-10-CM

## 2013-11-04 DIAGNOSIS — Z23 Encounter for immunization: Secondary | ICD-10-CM

## 2013-11-04 NOTE — Progress Notes (Signed)
   Subjective:    Patient ID: Cheryl Hanson, female    DOB: Jan 27, 1981, 33 y.o.   MRN: [id number]  HPI  She is here for a physical;acute issues  include  spotting with cardiovascular exercise. She has no other bleeding dyscrasias. She had PMH of childhood TB ;but the specifics are very vague. Proof of non infectious state required by her job.    Review of Systems Epistaxis, hemoptysis, hematuria, melena, or rectal bleeding denied. No unexplained weight loss, significant dyspepsia,dysphagia, or abdominal pain.  There is no abnormal bruising , bleeding, or difficulty stopping bleeding with injury.   There is no significant cough, sputum production, wheezing,fever , chills or sweats.     Objective:   Physical Exam Gen.: Healthy and well-nourished in appearance. Alert, appropriate and cooperative throughout exam. Appears younger than stated age  Head: Normocephalic without obvious abnormalities  Eyes: No corneal or conjunctival inflammation noted. Pupils equal round reactive to light and accommodation. Extraocular motion intact.  Ears: External  ear exam reveals no significant lesions or deformities. Canals clear .TMs normal. Hearing is grossly normal bilaterally. Nose: External nasal exam reveals no deformity or inflammation. Nasal mucosa are pink and moist. No lesions or exudates noted.   Mouth: Oral mucosa and oropharynx reveal no lesions or exudates. Teeth in good repair. Neck: No deformities, masses, or tenderness noted. Range of motion & Thyroid  normal. Lungs: Normal respiratory effort; chest expands symmetrically. Lungs are clear to auscultation without rales, wheezes, or increased work of breathing. Heart: Normal rate and rhythm. Normal S1 and S2. No gallop, click, or rub. No murmur. Abdomen: Bowel sounds normal; abdomen soft and nontender. No masses, organomegaly or hernias noted. Genitalia: as per Gyn                                  Musculoskeletal/extremities: No deformity  or scoliosis noted of  the thoracic or lumbar spine.  No clubbing, cyanosis, edema, or significant extremity  deformity noted. Range of motion normal .Tone & strength normal. Hand joints normal Fingernail / toenail health good. Able to lie down & sit up w/o help. Negative SLR bilaterally Vascular: Carotid, radial artery, dorsalis pedis and  posterior tibial pulses are full and equal. No bruits present. Neurologic: Alert and oriented x3. Deep tendon reflexes symmetrical and normal.  Gait normal .       Skin: Intact without suspicious lesions or rashes. Lymph: No cervical, axillary lymphadenopathy present. Psych: Mood and affect are normal. Normally interactive                                                                                        Assessment & Plan:   #1 comprehensive physical exam; no acute findings #2 spotting with exercise ; options to be explored with Gyn #3 PMH of childhood TB; no evidence of active infection  Plan: see Orders  & Recommendations

## 2013-11-04 NOTE — Progress Notes (Signed)
Pre visit review using our clinic review tool, if applicable. No additional management support is needed unless otherwise documented below in the visit note. 

## 2013-11-04 NOTE — Patient Instructions (Addendum)
   Because of your past history; you should not have tuberculin skin testing. This could result in a severe reaction, including a deep ulcer. If proof of nonactive tuberculosis status is needed; a blood test ( Quantiferon Gold assay) or single view chest x-ray is recommended during menses.  Your next office appointment will be determined based upon review of your pending labs &/ or x-ray. Those instructions will be transmitted to you through My Chart .

## 2013-11-05 ENCOUNTER — Encounter: Payer: BC Managed Care – PPO | Admitting: Internal Medicine

## 2013-11-06 LAB — QUANTIFERON TB GOLD ASSAY (BLOOD)
Interferon Gamma Release Assay: NEGATIVE
Mitogen value: 9.91 IU/mL
QUANTIFERON TB AG MINUS NIL: 0.06 [IU]/mL
Quantiferon Nil Value: 0.02 IU/mL
TB Ag value: 0.08 IU/mL

## 2014-09-01 ENCOUNTER — Encounter: Payer: Self-pay | Admitting: Internal Medicine

## 2014-09-01 ENCOUNTER — Ambulatory Visit (INDEPENDENT_AMBULATORY_CARE_PROVIDER_SITE_OTHER): Payer: BC Managed Care – PPO | Admitting: Internal Medicine

## 2014-09-01 VITALS — BP 108/66 | HR 63 | Temp 97.9°F | Ht 66.0 in | Wt 150.0 lb

## 2014-09-01 DIAGNOSIS — J069 Acute upper respiratory infection, unspecified: Secondary | ICD-10-CM | POA: Diagnosis not present

## 2014-09-01 MED ORDER — AZELASTINE HCL 0.1 % NA SOLN: 2.0000 | Freq: Every evening | NASAL | Status: DC | PRN

## 2014-09-01 MED ORDER — BENZONATATE 200 MG PO CAPS: 200.0000 mg | ORAL_CAPSULE | Freq: Three times a day (TID) | ORAL | Status: DC | PRN

## 2014-09-01 NOTE — Progress Notes (Signed)
Pre visit review using our clinic review tool, if applicable. No additional management support is needed unless otherwise documented below in the visit note. 

## 2014-09-01 NOTE — Progress Notes (Signed)
   Subjective:    Patient ID: Cheryl Hanson, female    DOB: [birth year], 34 y.o.   MRN: [id number]  DOS:  09/01/2014 Type of visit - description : Acute Interval history: Symptoms started ~ a week ago with nasal congestion, runny nose, cough; taking OTC Mucinex with mild relief. Cough is worse during the daytime. No sick contacts but is working at [employer] w/  Children and very likely she has been exposed to viruses .Denies a history of asthma   Review of Systems No fever or chills but she feels sometimes cold/hot No nausea, vomiting, diarrhea. No myalgias. No sputum production  Past Medical History  Diagnosis Date  . BPV (benign positional vertigo)     Past Surgical History  Procedure Laterality Date  . Tonsillectomy and adenoidectomy    . Umbilical hernia repair      History   Social History  . Marital Status: Married    Spouse Name: N/A  . Number of Children: 1  . Years of Education: N/A   Occupational History  . [employer] summer , teacher    Social History Main Topics  . Smoking status: Never Smoker   . Smokeless tobacco: Not on file  . Alcohol Use: No  . Drug Use: No  . Sexual Activity: Not on file   Other Topics Concern  . Not on file   Social History Narrative        Medication List       This list is accurate as of: 09/01/14 11:59 PM.  Always use your most recent med list.               azelastine 0.1 % nasal spray  Commonly known as:  ASTELIN  Place 2 sprays into both nostrils at bedtime as needed for rhinitis. Use in each nostril as directed     benzonatate 200 MG capsule  Commonly known as:  TESSALON  Take 1 capsule (200 mg total) by mouth 3 (three) times daily as needed for cough.           Objective:   Physical Exam BP 108/66 mmHg  Pulse 63  Temp(Src) 97.9 F (36.6 C) (Oral)  Ht 5\' 6"  (1.676 m)  Wt 150 lb (68.04 kg)  BMI 24.22 kg/m2  SpO2 99%  LMP 09/01/2014 (Exact Date)  General:   Well developed, well nourished . NAD,  sporadic cough noted during the visit.  HEENT:  Normocephalic . Face symmetric, atraumatic TMs normal, throat symmetric, not red; nose moderately congested, sinuses not tender to palpation. Lungs:  CTA B Normal respiratory effort, no intercostal retractions, no accessory muscle use. Heart: RRR,  no murmur.  No pretibial edema bilaterally  Skin: Not pale. Not jaundice Neurologic:  alert & oriented X3.  Speech normal, gait appropriate for age and unassisted Psych--  Cognition and judgment appear intact.  Cooperative with normal attention span and concentration.  Behavior appropriate. No anxious or depressed appearing.       Assessment & Plan:    URI, Symptoms likely due to a URI with persisting cough. Plan: Flonase, Mucinex, Tessalon Perles, Astelin. If not better will let me know

## 2014-09-01 NOTE — Patient Instructions (Signed)
take Mucinex DM twice a day as needed for cough  If the cough continue, take the prescribed medication called Tessalon Perles   OTC Nasocort or Flonase : 2 nasal sprays on each side of the nose daily until you feel better. Use it in the morning  Astelin, a prescribed nasal spray:  2 sprays on the side of the nose every night until better    Call if not gradually better over the next  5 days  Call anytime if the symptoms are severe

## 2015-02-14 ENCOUNTER — Telehealth: Payer: Self-pay | Admitting: Internal Medicine

## 2015-02-14 DIAGNOSIS — Z111 Encounter for screening for respiratory tuberculosis: Secondary | ICD-10-CM

## 2015-02-14 NOTE — Telephone Encounter (Signed)
Is requesting lab order for TB testing

## 2015-02-15 NOTE — Telephone Encounter (Signed)
Please advise, Dr. Alwyn RenHopper is not in the office today and patient would like lab order for TB testing.

## 2015-02-15 NOTE — Telephone Encounter (Signed)
The order has been placed for her to complete at her convenience.

## 2015-02-23 ENCOUNTER — Other Ambulatory Visit: Payer: BC Managed Care – PPO

## 2015-02-23 ENCOUNTER — Ambulatory Visit: Payer: BC Managed Care – PPO | Admitting: Internal Medicine

## 2015-02-23 DIAGNOSIS — Z111 Encounter for screening for respiratory tuberculosis: Secondary | ICD-10-CM

## 2015-02-25 ENCOUNTER — Encounter: Payer: Self-pay | Admitting: Family

## 2015-02-25 LAB — QUANTIFERON TB GOLD ASSAY (BLOOD)
INTERFERON GAMMA RELEASE ASSAY: NEGATIVE
QUANTIFERON NIL VALUE: 0.03 [IU]/mL
Quantiferon Tb Ag Minus Nil Value: 0.02 IU/mL
TB Ag value: 0.05 IU/mL

## 2015-04-01 ENCOUNTER — Telehealth: Payer: Self-pay | Admitting: Internal Medicine

## 2015-04-01 ENCOUNTER — Ambulatory Visit (INDEPENDENT_AMBULATORY_CARE_PROVIDER_SITE_OTHER): Payer: BC Managed Care – PPO | Admitting: Nurse Practitioner

## 2015-04-01 VITALS — BP 124/74 | HR 100 | Temp 98.4°F | Ht 66.0 in | Wt 155.5 lb

## 2015-04-01 DIAGNOSIS — F450 Somatization disorder: Secondary | ICD-10-CM | POA: Diagnosis not present

## 2015-04-01 DIAGNOSIS — F419 Anxiety disorder, unspecified: Secondary | ICD-10-CM

## 2015-04-01 DIAGNOSIS — F411 Generalized anxiety disorder: Secondary | ICD-10-CM

## 2015-04-01 DIAGNOSIS — F410 Panic disorder [episodic paroxysmal anxiety] without agoraphobia: Secondary | ICD-10-CM

## 2015-04-01 MED ORDER — ALPRAZOLAM 0.25 MG PO TABS: 0.2500 mg | ORAL_TABLET | Freq: Every evening | ORAL | Status: DC | PRN

## 2015-04-01 NOTE — Patient Instructions (Signed)
Fax number [phone]   Attn: Naomie Dean, NP

## 2015-04-01 NOTE — Telephone Encounter (Signed)
FMLA paperwork has been faxed.   LVM for pt to call back as soon as possible.  RE: informed that fmla paperwork has been faxed.

## 2015-04-01 NOTE — Progress Notes (Signed)
Pre visit review using our clinic review tool, if applicable. No additional management support is needed unless otherwise documented below in the visit note. 

## 2015-04-01 NOTE — Telephone Encounter (Signed)
It is ready to be faxed. You can call and let her know we faxed it since she is highly anxious today. Thanks!

## 2015-04-01 NOTE — Progress Notes (Signed)
Patient ID: Cheryl Hanson, female    DOB: 11/27/1980  Age: 35 y.o. MRN: [id number]  CC: Panic Attack   HPI Cheryl Hanson presents for CC of panic attacks x 1 month.   1) patient is visibly upset today in the office she is having an active panic attack After letting her settle down after several minutes and diverting the conversation she is able to tell me the story of what is happening She reports for about one month she has been having increasing anxiety multiple panic attacks daily and somatic complaints of nausea and vomiting due to the panic attacks and anxiety. She denies thoughts of harm to herself or others. She keeps stating over and over again that she cannot go back to work. Work is stressful environment that is contributing to her symptoms. She is a single mother of a six-year-old son and is stressed out about caring for him if she is to find another job.   Patient reports she would like to see: [facility]on [facility]  [phone]  History Cheryl Hanson has a past medical history of BPV (benign positional vertigo).   She has past surgical history that includes Tonsillectomy and adenoidectomy and Umbilical hernia repair.   Her family history includes Heart attack in her maternal grandfather; Hypertension in her father; Throat cancer in her father. There is no history of COPD, Diabetes, or Stroke.She reports that she has never smoked. She does not have any smokeless tobacco history on file. She reports that she does not drink alcohol or use illicit drugs.  Outpatient Prescriptions Prior to Visit  Medication Sig Dispense Refill  . azelastine (ASTELIN) 0.1 % nasal spray Place 2 sprays into both nostrils at bedtime as needed for rhinitis. Use in each nostril as directed 30 mL 3  . benzonatate (TESSALON) 200 MG capsule Take 1 capsule (200 mg total) by mouth 3 (three) times daily as needed for cough. 20 capsule 0   No facility-administered  medications prior to visit.    ROS Review of Systems  Constitutional: Negative for fever, chills, diaphoresis and fatigue.  Gastrointestinal: Positive for nausea and vomiting. Negative for diarrhea.  Musculoskeletal: Negative for myalgias, neck pain and neck stiffness.  Skin: Negative for rash.  Psychiatric/Behavioral: Positive for sleep disturbance. Negative for suicidal ideas, hallucinations, behavioral problems, confusion, dysphoric mood, decreased concentration and agitation. The patient is nervous/anxious. The patient is not hyperactive.     Objective:  BP 124/74 mmHg  Pulse 100  Temp(Src) 98.4 F (36.9 C) (Oral)  Ht  (1.676 m)  Wt 155 lb 8 oz (70.534 kg)  BMI 25.11 kg/m2  SpO2 97%  LMP 03/18/2015  Physical Exam  Constitutional: She appears well-developed and well-nourished. She appears distressed.  Cardiovascular: Regular rhythm.   Pulmonary/Chest:  Hyperventilating when crying today  Skin: She is not diaphoretic.  Psychiatric: Her speech is normal and behavior is normal. Judgment and thought content normal. Her mood appears anxious. Her affect is not angry, not blunt, not labile and not inappropriate. Cognition and memory are normal. She does not exhibit a depressed mood.  Patient is actively having panic attack today in office She exhibits hyperventilation, crying, unable to complete sentences   Assessment & Plan:   Cheryl Hanson was seen today for panic attack.  Diagnoses and all orders for this visit:  GAD (generalized anxiety disorder) -     Ambulatory referral to Psychology  Panic attacks -     Ambulatory referral to Psychology  Anxiety  with somatization -     Ambulatory referral to Psychology  Other orders -     ALPRAZolam (XANAX) 0.25 MG tablet; Take 1 tablet (0.25 mg total) by mouth at bedtime as needed for anxiety.   I have discontinued [name]'s benzonatate and azelastine. I am also having her start on ALPRAZolam.  Meds ordered this encounter   Medications  . ALPRAZolam (XANAX) 0.25 MG tablet    Sig: Take 1 tablet (0.25 mg total) by mouth at bedtime as needed for anxiety.    Dispense:  60 tablet    Refill:  0    Order Specific Question:  Supervising Provider    Answer:  Sherlene Shams [[id number]]     Follow-up: No Follow-up on file.

## 2015-04-01 NOTE — Telephone Encounter (Signed)
Patient called to advise regarding paperwork.  Beginning out of work date is 04/01/2015 Last day of work is 08/26/2015

## 2015-04-05 ENCOUNTER — Encounter: Payer: Self-pay | Admitting: Nurse Practitioner

## 2015-04-05 DIAGNOSIS — F410 Panic disorder [episodic paroxysmal anxiety] without agoraphobia: Secondary | ICD-10-CM | POA: Insufficient documentation

## 2015-04-05 DIAGNOSIS — F450 Somatization disorder: Secondary | ICD-10-CM

## 2015-04-05 DIAGNOSIS — F419 Anxiety disorder, unspecified: Secondary | ICD-10-CM | POA: Insufficient documentation

## 2015-04-05 DIAGNOSIS — F411 Generalized anxiety disorder: Secondary | ICD-10-CM | POA: Insufficient documentation

## 2015-04-05 NOTE — Assessment & Plan Note (Signed)
Her anxiety and panic attacks are manifesting has nausea and vomiting daily. See GAD and panic attacks for further information Unable to work with such severe anxiety and panic attacks FMLA paperwork started

## 2015-04-05 NOTE — Assessment & Plan Note (Addendum)
New onset Severe to the point where she is having panic attacks daily This is interfering with her life She is unable to care for her son currently because she is panicked FMLA paperwork initiated at visit today Referral to psychology placed Xanax 0.25 mg 1-2 tablets daily by mouth as needed for panic attacks follow-up in 1-2 weeks

## 2015-04-05 NOTE — Assessment & Plan Note (Addendum)
New onset Patient is actively having panic attack in office today FMLA paperwork was initiated at today's visit Xanax 0.25 mg tablets 1-2 times daily as needed for panic attacks given Refer to counseling of choice Follow-up in 1-2 weeks or sooner as needed  I have personally spent 25 minutes face-to-face with patient greater than 50% of time spent on counseling, obtaining history, consoling patient, and treatment decision making.

## 2015-05-20 ENCOUNTER — Telehealth: Payer: Self-pay | Admitting: Internal Medicine

## 2015-05-20 NOTE — Telephone Encounter (Signed)
Pt called wanting to make an appointment with Lyla Sonarrie in May for her FMLA paperwork. Lyla SonCarrie states she will need to see her primary for this, but if she has to she will see her but she will more than likely be at the [place] office. I informed her on her vm and for her to call back because we do need to get her established with another provider.

## 2017-05-31 ENCOUNTER — Ambulatory Visit: Payer: BC Managed Care – PPO | Admitting: Nurse Practitioner

## 2017-06-17 ENCOUNTER — Ambulatory Visit: Payer: BC Managed Care – PPO | Admitting: Nurse Practitioner

## 2017-06-25 ENCOUNTER — Encounter (INDEPENDENT_AMBULATORY_CARE_PROVIDER_SITE_OTHER): Payer: Self-pay | Admitting: Orthopaedic Surgery

## 2017-06-25 ENCOUNTER — Ambulatory Visit (INDEPENDENT_AMBULATORY_CARE_PROVIDER_SITE_OTHER): Payer: Self-pay | Admitting: Orthopaedic Surgery

## 2017-06-25 DIAGNOSIS — M25532 Pain in left wrist: Secondary | ICD-10-CM

## 2017-06-25 LAB — SEDIMENTATION RATE: Sed Rate: 2 mm/h (ref 0–20)

## 2017-06-25 MED ORDER — IBUPROFEN 800 MG PO TABS: 800.0000 mg | ORAL_TABLET | Freq: Three times a day (TID) | ORAL | 2 refills | Status: DC | PRN

## 2017-06-25 MED ORDER — PREDNISONE 10 MG (21) PO TBPK: ORAL_TABLET | ORAL | 0 refills | Status: DC

## 2017-06-25 MED ORDER — AMITRIPTYLINE HCL 50 MG PO TABS: 50.0000 mg | ORAL_TABLET | Freq: Every evening | ORAL | 0 refills | Status: DC | PRN

## 2017-06-25 MED ORDER — GABAPENTIN 100 MG PO CAPS: 100.0000 mg | ORAL_CAPSULE | Freq: Three times a day (TID) | ORAL | 3 refills | Status: DC

## 2017-06-25 NOTE — Progress Notes (Signed)
Office Visit Note   Patient: Cheryl Hanson           Date of Birth: [birth year]           MRN: [id number] Visit Date: 06/25/2017              Requested by: Hendricks Limes, [place], [place] [address] PCP: Hendricks Limes, MD   Assessment & Plan: Visit Diagnoses:  1. Pain in left wrist     Plan: Overall impression is a 37 year old female with left wrist and hand pain.  I think at this point major component of her pain is due to overall mobilization and secondary stiffness that has developed.  I wonder if she may have CRPS therefore I am going to put her on the appropriate medicines.  I do not get the sense that she is malingering.  I have made a referral for her to go to hand therapy to help mobilize her joint and restore her hand function.  I personally reviewed the MRI and the x-rays and I do not believe there are any surgical indications.  We will also obtain arthritis panel to evaluate for inflammatory arthritis or gout.  Patient in agreement with the plan.  Follow-up as needed. Total face to face encounter time was greater than 45 minutes and over half of this time was spent in counseling and/or coordination of care. Follow-Up Instructions: Return if symptoms worsen or fail to improve.   Orders:  Orders Placed This Encounter  Procedures  . Sed Rate (ESR)  . C-reactive protein  . Rheumatoid Factor  . Uric acid   Meds ordered this encounter  Medications  . predniSONE (STERAPRED UNI-PAK 21 TAB) 10 MG (21) TBPK tablet    Sig: Take as directed    Dispense:  21 tablet    Refill:  0  . gabapentin (NEURONTIN) 100 MG capsule    Sig: Take 1 capsule (100 mg total) by mouth 3 (three) times daily.    Dispense:  90 capsule    Refill:  3  . ibuprofen (ADVIL,MOTRIN) 800 MG tablet    Sig: Take 1 tablet (800 mg total) by mouth every 8 (eight) hours as needed.    Dispense:  60 tablet    Refill:  2  . amitriptyline (ELAVIL) 50 MG tablet    Sig: Take 1 tablet (50 mg  total) by mouth at bedtime as needed for sleep.    Dispense:  30 tablet    Refill:  0      Procedures: No procedures performed   Clinical Data: No additional findings.   Subjective: Chief Complaint  Patient presents with  . Left Wrist - Pain    Cheryl Hanson is a pleasant 37 year old female who comes in with left hand and wrist pain for 7 weeks after she held up her left hand in order to block a vase from hitting her face.  She was struck on the palmar surface of the hand near the base.  She has been seen for multiple clinic visits with an outside orthopedic surgeon.  X-rays and MRIs have been unremarkable.  She has done home exercises and has worn a wrist brace for the pain.  She does not endorse any numbness but does endorse swelling and pain.  She has significant pain with movement of her fingers.  Denies any history of rheumatoid arthritis or gout.   Review of Systems  Constitutional: Negative.   HENT: Negative.   Eyes:  Negative.   Respiratory: Negative.   Cardiovascular: Negative.   Endocrine: Negative.   Musculoskeletal: Negative.   Neurological: Negative.   Hematological: Negative.   Psychiatric/Behavioral: Negative.   All other systems reviewed and are negative.    Objective: Vital Signs: There were no vitals taken for this visit.  Physical Exam  Constitutional: She is oriented to person, place, and time. She appears well-developed and well-nourished.  HENT:  Head: Normocephalic and atraumatic.  Eyes: EOM are normal.  Neck: Neck supple.  Pulmonary/Chest: Effort normal.  Abdominal: Soft.  Neurological: She is alert and oriented to person, place, and time.  Skin: Skin is warm. Capillary refill takes less than 2 seconds.  Psychiatric: She has a normal mood and affect. Her behavior is normal. Judgment and thought content normal.  Nursing note and vitals reviewed.   Ortho Exam Left hand and wrist exam shows her fingers held in flexion with guarding and  unwillingness to extend her fingers due to pain on the flexor surface of her hand and forearm.  She has no soft tissue crepitus over her extensor tendons.  There is no triggering or locking.  She does the most tenderness to palpation over the trapezial ridge and FCR tendon in the base of the palm.  She does not have any skin color changes or allodynia. Specialty Comments:  No specialty comments available.  Imaging: No results found.   PMFS History: Patient Active Problem List   Diagnosis Date Noted  . GAD (generalized anxiety disorder) 04/05/2015  . Panic attacks 04/05/2015  . Anxiety with somatization 04/05/2015  . Cough 06/04/2012  . NYSTAGMUS 03/27/2010  . GERD 03/27/2010  . BENIGN POSITIONAL VERTIGO 12/06/2009  . TUBERCULOSIS, HX OF 10/25/2006   Past Medical History:  Diagnosis Date  . BPV (benign positional vertigo)     Family History  Problem Relation Age of Onset  . Throat cancer Father        non cigarette smoker  . Hypertension Father        resolved post weight loss  . Heart attack Maternal Grandfather        > 55  . COPD Neg Hx   . Diabetes Neg Hx   . Stroke Neg Hx     Past Surgical History:  Procedure Laterality Date  . TONSILLECTOMY AND ADENOIDECTOMY    . UMBILICAL HERNIA REPAIR     Social History   Occupational History  . 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 Smoking status: Never Smoker  . Smokeless tobacco: Never Used  Substance and Sexual Activity  . Alcohol use: No    Alcohol/week: 0.0 oz  . Drug use: No  . Sexual activity: Not on file

## 2017-06-26 LAB — C-REACTIVE PROTEIN: CRP: 1.8 mg/L (ref <8.0)

## 2017-06-26 LAB — URIC ACID: Uric Acid, Serum: 4.2 mg/dL (ref 2.5–7.0)

## 2017-06-26 LAB — RHEUMATOID FACTOR

## 2017-07-04 ENCOUNTER — Telehealth (INDEPENDENT_AMBULATORY_CARE_PROVIDER_SITE_OTHER): Payer: Self-pay | Admitting: Orthopaedic Surgery

## 2017-07-04 NOTE — Telephone Encounter (Signed)
Sam from [facility] called asking for the op notes/office notes from patients previous visits. Fax # [phone] or CB # [phone]

## 2017-07-05 ENCOUNTER — Telehealth (INDEPENDENT_AMBULATORY_CARE_PROVIDER_SITE_OTHER): Payer: Self-pay | Admitting: Orthopaedic Surgery

## 2017-07-05 NOTE — Telephone Encounter (Signed)
Faxed OV note to [facility] [phone]

## 2017-07-05 NOTE — Telephone Encounter (Signed)
FAXED OFFICE NOTE TO Dore[facility] Martin [phone]

## 2017-08-27 ENCOUNTER — Telehealth (INDEPENDENT_AMBULATORY_CARE_PROVIDER_SITE_OTHER): Payer: Self-pay | Admitting: Orthopaedic Surgery

## 2017-08-27 NOTE — Telephone Encounter (Signed)
Patient is asking you to email her a note/letter or either a copy of the rx pad where Dr. Roda ShuttersXu referred her to physical therapy for her left hand. Her email is   [email or web address]

## 2017-08-28 NOTE — Telephone Encounter (Signed)
Emailed to patient.

## 2017-09-23 ENCOUNTER — Other Ambulatory Visit (HOSPITAL_COMMUNITY): Payer: Self-pay | Admitting: *Deleted

## 2017-09-23 DIAGNOSIS — N6310 Unspecified lump in the right breast, unspecified quadrant: Secondary | ICD-10-CM

## 2017-10-17 ENCOUNTER — Ambulatory Visit
Admission: RE | Admit: 2017-10-17 | Discharge: 2017-10-17 | Disposition: A | Discharge status: Home / Self Care | Payer: No Typology Code available for payment source | Source: Ambulatory Visit | Attending: Obstetrics and Gynecology | Admitting: Obstetrics and Gynecology

## 2017-10-17 ENCOUNTER — Other Ambulatory Visit (HOSPITAL_COMMUNITY): Payer: Self-pay | Admitting: Obstetrics and Gynecology

## 2017-10-17 ENCOUNTER — Ambulatory Visit (HOSPITAL_COMMUNITY)
Admission: RE | Admit: 2017-10-17 | Discharge: 2017-10-17 | Disposition: A | Discharge status: Home / Self Care | Payer: Self-pay | Source: Ambulatory Visit | Attending: Obstetrics and Gynecology | Admitting: Obstetrics and Gynecology

## 2017-10-17 ENCOUNTER — Encounter (HOSPITAL_COMMUNITY): Payer: Self-pay

## 2017-10-17 VITALS — BP 110/72 | Ht 66.0 in | Wt 176.0 lb

## 2017-10-17 DIAGNOSIS — N63 Unspecified lump in unspecified breast: Secondary | ICD-10-CM

## 2017-10-17 DIAGNOSIS — N6310 Unspecified lump in the right breast, unspecified quadrant: Secondary | ICD-10-CM

## 2017-10-17 DIAGNOSIS — N6314 Unspecified lump in the right breast, lower inner quadrant: Secondary | ICD-10-CM

## 2017-10-17 DIAGNOSIS — Z1239 Encounter for other screening for malignant neoplasm of breast: Secondary | ICD-10-CM

## 2017-10-17 DIAGNOSIS — N6320 Unspecified lump in the left breast, unspecified quadrant: Secondary | ICD-10-CM

## 2017-10-17 NOTE — Progress Notes (Signed)
Complaints of right breast lump and pain x 2 months. Patient states the pain comes and goes. Patient rates the pain at a 8-9 out of 10.  Pap Smear: Pap smear not completed today. [facility] and normal. Per patient has no history of an abnormal Pap smear. Last Pap smear result is in Epic.  Physical exam: Breasts Breasts symmetrical. No skin abnormalities bilateral breasts. No nipple retraction bilateral breasts. No nipple discharge bilateral breasts. No lymphadenopathy. No lumps palpated left breast. Palpated a pea sized lump within the right breast at 5 o'clock under areola. Complaints of tenderness when palpated lump. [facility] and right breast ultrasound. Appointment scheduled for Thursday, October 17, 2017 at 1520.        Pelvic/Bimanual No Pap smear completed today since last Pap smear was 07/02/2017. Pap smear not indicated per BCCCP guidelines.   Smoking History: Patient has never smoked.  Patient Navigation: Patient education provided. Access to services provided for patient through BCCCP program.   Breast and Cervical Cancer Risk Assessment: Patient has no family history of breast cancer, known genetic mutations, or radiation treatment to the chest before age 38. Patient has no history of cervical dysplasia, immunocompromised, or DES exposure in-utero.  Risk Assessment    Risk Scores      10/17/2017   Last edited by: Lynnell DikeHolland, Sabrina H, LPN   5-year risk: 0.5 %   Lifetime risk: 10.3 %

## 2017-10-17 NOTE — Patient Instructions (Signed)
Explained breast self awareness with Cheryl Hanson. Patient did not need a Pap smear today due to last Pap smear was 07/02/2017. Let her know BCCCP will cover Pap smears every 3 years unless has a history of abnormal Pap smears. [facility] a diagnostic mamm[facility] and right breast ultrasound. Appointment scheduled for Thursday, October 17, 2017 at 1520.  Patient aware of appointment and will be there. Cheryl Hanson verbalized understanding.  Cheryl Hanson, Kathaleen Maserhristine Poll, RN 2:14 PM

## 2018-04-25 ENCOUNTER — Other Ambulatory Visit: Payer: Self-pay | Admitting: Obstetrics and Gynecology

## 2018-04-25 ENCOUNTER — Ambulatory Visit
Admission: RE | Admit: 2018-04-25 | Discharge: 2018-04-25 | Disposition: A | Discharge status: Home / Self Care | Payer: BLUE CROSS/BLUE SHIELD | Source: Ambulatory Visit | Attending: Obstetrics and Gynecology | Admitting: Obstetrics and Gynecology

## 2018-04-25 DIAGNOSIS — N63 Unspecified lump in unspecified breast: Secondary | ICD-10-CM

## 2018-04-25 DIAGNOSIS — N6489 Other specified disorders of breast: Secondary | ICD-10-CM | POA: Diagnosis not present

## 2018-05-05 DIAGNOSIS — J069 Acute upper respiratory infection, unspecified: Secondary | ICD-10-CM | POA: Diagnosis not present

## 2018-05-10 DIAGNOSIS — J209 Acute bronchitis, unspecified: Secondary | ICD-10-CM | POA: Diagnosis not present

## 2018-05-10 DIAGNOSIS — H6503 Acute serous otitis media, bilateral: Secondary | ICD-10-CM | POA: Diagnosis not present

## 2018-05-12 ENCOUNTER — Encounter (HOSPITAL_COMMUNITY): Payer: Self-pay | Admitting: Emergency Medicine

## 2018-05-12 ENCOUNTER — Other Ambulatory Visit: Payer: Self-pay

## 2018-05-12 ENCOUNTER — Emergency Department (HOSPITAL_COMMUNITY)
Admission: EM | Admit: 2018-05-12 | Discharge: 2018-05-12 | Disposition: A | Discharge status: Home / Self Care | Payer: BLUE CROSS/BLUE SHIELD | Attending: Emergency Medicine | Admitting: Emergency Medicine

## 2018-05-12 ENCOUNTER — Emergency Department (HOSPITAL_COMMUNITY): Payer: BLUE CROSS/BLUE SHIELD

## 2018-05-12 DIAGNOSIS — R062 Wheezing: Secondary | ICD-10-CM | POA: Diagnosis not present

## 2018-05-12 DIAGNOSIS — R69 Illness, unspecified: Secondary | ICD-10-CM

## 2018-05-12 DIAGNOSIS — J111 Influenza due to unidentified influenza virus with other respiratory manifestations: Secondary | ICD-10-CM | POA: Diagnosis not present

## 2018-05-12 DIAGNOSIS — R05 Cough: Secondary | ICD-10-CM | POA: Diagnosis present

## 2018-05-12 MED ORDER — AEROCHAMBER PLUS FLO-VU LARGE MISC: 1.0000 | Freq: Once | Status: DC

## 2018-05-12 MED ORDER — ALBUTEROL SULFATE HFA 108 (90 BASE) MCG/ACT IN AERS
2.0000 | INHALATION_SPRAY | RESPIRATORY_TRACT | Status: DC | PRN
  Administered 2018-05-12: 2 via RESPIRATORY_TRACT
  Filled 2018-05-12: qty 6.7

## 2018-05-12 NOTE — ED Triage Notes (Signed)
States child had flu  And pneumoniaand  Cough and  She went to dr last Monday and was neg for flu then , ran a fever Sunday  And cough is worse saw another dr 2 days ago was given  zpack and pred but not any better

## 2018-05-12 NOTE — Discharge Instructions (Addendum)
Home care isolation guidelines given Patient advised regarding return precautions

## 2018-05-12 NOTE — ED Provider Notes (Signed)
[facility] EMERGENCY DEPARTMENT Provider Note   CSN: [id number] Arrival date & time: 05/12/18  1047    History   Chief Complaint Chief Complaint  Patient presents with  . Cough    HPI Cheryl Hanson is a 38 y.o. female.     HPI   38 year old female no significant history of medical problems presents to the ED with 10days cough, congestion, and dyspnea.  Her daughter did test flu positive.  She was tested a week ago for flu and it was negative.  She continued to have cough and increasing shortness of breath.  She was seen again and placed on Zithromax a prednisone taper without relief she is continued to have cough and congestion.  The fever has resolved for several days.  She has no known contacts for covered 19.  She has not been traveling and is not in healthcare. No nausea vomiting or diarrhea.  Past Medical History:  Diagnosis Date  . BPV (benign positional vertigo)     Patient Active Problem List   Diagnosis Date Noted  . GAD (generalized anxiety disorder) 04/05/2015  . Panic attacks 04/05/2015  . Anxiety with somatization 04/05/2015  . Cough 06/04/2012  . NYSTAGMUS 03/27/2010  . GERD 03/27/2010  . BENIGN POSITIONAL VERTIGO 12/06/2009  . TUBERCULOSIS, HX OF 10/25/2006    Past Surgical History:  Procedure Laterality Date  . TONSILLECTOMY AND ADENOIDECTOMY    . UMBILICAL HERNIA REPAIR       OB History    Gravida  2   Para      Term      Preterm      AB  1   Living  1     SAB      TAB  1   Ectopic      Multiple      Live Births  1            Home Medications    Prior to Admission medications   Medication Sig Start Date End Date Taking? Authorizing Provider  ALPRAZolam (XANAX) 0.25 MG tablet Take 1 tablet (0.25 mg total) by mouth at bedtime as needed for anxiety. Patient not taking: Reported on 10/17/2017 04/01/15   Carollee Leitz, RN  amitriptyline (ELAVIL) 50 MG tablet Take 1 tablet (50 mg total) by mouth at  bedtime as needed for sleep. Patient not taking: Reported on 10/17/2017 06/25/17   Tarry Kos, MD  gabapentin (NEURONTIN) 100 MG capsule Take 1 capsule (100 mg total) by mouth 3 (three) times daily. Patient not taking: Reported on 10/17/2017 06/25/17   Tarry Kos, MD  ibuprofen (ADVIL,MOTRIN) 800 MG tablet Take 1 tablet (800 mg total) by mouth every 8 (eight) hours as needed. Patient not taking: Reported on 10/17/2017 06/25/17   Tarry Kos, MD  predniSONE (STERAPRED UNI-PAK 21 TAB) 10 MG (21) TBPK tablet Take as directed Patient not taking: Reported on 10/17/2017 06/25/17   Tarry Kos, MD    Family History Family History  Problem Relation Age of Onset  . Throat cancer Father        non cigarette smoker  . Hypertension Father        resolved post weight loss  . Heart attack Maternal Grandfather        > 55  . COPD Neg Hx   . Diabetes Neg Hx   . Stroke Neg Hx     Social History Social History   Tobacco Use  .  Smoking status: Never Smoker  . Smokeless tobacco: Never Used  Substance Use Topics  . Alcohol use: No    Alcohol/week: 0.0 standard drinks  . Drug use: No     Allergies   Patient has no known allergies.   Review of Systems Review of Systems  All other systems reviewed and are negative.    Physical Exam Updated Vital Signs BP 131/72 (BP Location: Left Arm)   Pulse 86   Temp (!) 97.5 F (36.4 C) (Oral)   Resp 18   SpO2 97%   Physical Exam Vitals signs and nursing note reviewed.  Constitutional:      Appearance: Normal appearance. She is not ill-appearing.  HENT:     Head: Normocephalic and atraumatic.     Right Ear: External ear normal.     Left Ear: External ear normal.     Nose: Nose normal.     Mouth/Throat:     Mouth: Mucous membranes are moist.     Pharynx: Oropharynx is clear. No oropharyngeal exudate or posterior oropharyngeal erythema.  Eyes:     Pupils: Pupils are equal, round, and reactive to light.  Neck:     Musculoskeletal:  Normal range of motion.  Cardiovascular:     Rate and Rhythm: Normal rate and regular rhythm.  Pulmonary:     Effort: Pulmonary effort is normal.     Breath sounds: Wheezing present.     Comments: Mild diffuse expiratory wheezes Abdominal:     General: Abdomen is flat. Bowel sounds are normal.  Musculoskeletal: Normal range of motion.        General: No swelling or tenderness.  Skin:    General: Skin is warm and dry.     Capillary Refill: Capillary refill takes less than 2 seconds.  Neurological:     General: No focal deficit present.     Mental Status: She is alert.  Psychiatric:        Mood and Affect: Mood normal.      ED Treatments / Results  Labs (all labs ordered are listed, but only abnormal results are displayed) Labs Reviewed - No data to display  EKG None  Radiology No results found.  Procedures Procedures (including critical care time)  Medications Ordered in ED Medications  albuterol (PROVENTIL HFA;VENTOLIN HFA) 108 (90 Base) MCG/ACT inhaler 2 puff (has no administration in time range)  AeroChamber Plus Flo-Vu Medium MISC 1 each (has no administration in time range)     Initial Impression / Assessment and Plan / ED Course  I have reviewed the triage vital signs and the nursing notes.  Pertinent labs & imaging results that were available during my care of the patient were reviewed by me and considered in my medical decision making (see chart for details).    With influenza-like illness last week but flu negative.  Her flulike symptoms have been improved since Friday.  However, she continues to cough and feel dyspneic.  Here she had some wheezing which improved with albuterol HFA.  She was ambulated and sats remained normal but she does get some tachycardia with walking.  She is to continue her Zithromax and prednisone.  She will use the HFA 2 puffs every 4 hours.  She is getting a pulse oximeter at the drugstore and will return if her sats are low.  She is  advised regarding follow-up with her primary care doctor if symptoms are not resolving.      Final Clinical Impressions(s) / ED Diagnoses  Final diagnoses:  Influenza-like illness  Wheezing    ED Discharge Orders    None       Margarita Grizzle, MD 05/12/18 1344

## 2018-06-17 ENCOUNTER — Other Ambulatory Visit: Payer: Self-pay

## 2018-06-17 ENCOUNTER — Encounter: Payer: Self-pay | Admitting: Internal Medicine

## 2018-06-17 ENCOUNTER — Ambulatory Visit (INDEPENDENT_AMBULATORY_CARE_PROVIDER_SITE_OTHER): Payer: BLUE CROSS/BLUE SHIELD | Admitting: Internal Medicine

## 2018-06-17 VITALS — BP 136/64 | HR 109 | Temp 98.3°F | Ht 66.5 in | Wt 178.6 lb

## 2018-06-17 DIAGNOSIS — R05 Cough: Secondary | ICD-10-CM | POA: Diagnosis not present

## 2018-06-17 DIAGNOSIS — R059 Cough, unspecified: Secondary | ICD-10-CM

## 2018-06-17 MED ORDER — PANTOPRAZOLE SODIUM 40 MG PO TBEC: 40.0000 mg | DELAYED_RELEASE_TABLET | Freq: Every day | ORAL | 2 refills | Status: DC

## 2018-06-17 MED ORDER — PREDNISONE 10 MG PO TABS: ORAL_TABLET | ORAL | 0 refills | Status: DC

## 2018-06-17 MED ORDER — FAMOTIDINE 20 MG PO TABS: ORAL_TABLET | ORAL | 11 refills | Status: DC

## 2018-06-17 MED ORDER — TRAMADOL HCL 50 MG PO TABS: 50.0000 mg | ORAL_TABLET | ORAL | 0 refills | Status: AC | PRN

## 2018-06-17 NOTE — Patient Instructions (Signed)
The key to effective treatment for your cough is eliminating the non-stop cycle of cough you're stuck in long enough to let your airway heal completely and then see if there is anything still making you cough once you stop the cough suppression, but this should take no more than 5 days to figure out  First take delsym two tsp every 12 hours and supplement if needed with  tramadol 50 mg up to 2 every 4 hours to suppress the urge to cough at all or even clear your throat. Swallowing water or using ice chips/non mint and menthol containing candies (such as lifesavers or sugarless jolly ranchers) are also effective.  You should rest your voice and avoid activities that you know make you cough.  Once you have eliminated the cough for 3 straight days try reducing the tramadol first,  then the delsym as tolerated.    Prednisone 10 mg take  4 each am x 2 days,   2 each am x 2 days,  1 each am x 2 days and stop (this is to eliminate allergies and inflammation from coughing)  Protonix (pantoprazole) Take 30-60 min before first meal of the day and Pepcid 20 mg one after supper  GERD (REFLUX)  is an extremely common cause of respiratory symptoms, many times with no significant heartburn at all.    It can be treated with medication, but also with lifestyle changes including avoidance of late meals, excessive alcohol, smoking cessation, and avoid fatty foods, chocolate, peppermint, colas, red wine, and acidic juices such as orange juice.  NO MINT OR MENTHOL PRODUCTS SO NO COUGH DROPS  USE HARD CANDY INSTEAD (jolley ranchers or Stover's or Lifesavers (all available in sugarless versions) NO OIL BASED VITAMINS - use powdered substitutes.   If not call better set up a televisit in one week

## 2018-06-17 NOTE — Progress Notes (Signed)
Cheryl Hanson, female    DOB: 22-Sep-1980    MRN: [id number]   Brief patient profile:  76 yowf never smoker with pattern of episodes of severe dry cough starting in 2006 p living in [place] x 5 years with spells typically lasting months and months  and occurring at least  twice yearly and resolves s any specific remedy and gets better sometimes with just cough drops with most recent episode onset early Fall 2019 and present since then so self-referred to pulmonary clinic 06/17/2018      History of Present Illness  06/17/2018  Pulmonary/ 1st office eval/Kamdyn Colborn  Chief Complaint  Patient presents with   Cough    Has had cough for years. Had chest xray in March furing ER visit. Was given inhaler at that time which gave some relief, but not complete.  Dyspnea:  Not limited by breathing from desired activities  / ex seems to make it worse Cough: see below / nonproductive/ assoc with urinary urgency no gag or vomit Sleep: seems worse at hs then resolves and does not wake her / onset w/in an hour SABA use: convinced albuterol helps 50% / prednisone did help     Kouffman Reflux v Neurogenic Cough Differentiator Reflux Comments  Do you awaken from a sound sleep coughing violently?                            With trouble breathing? no   Do you have choking episodes when you cannot  Get enough air, gasping for air ?              sometimes   Do you usually cough when you lie down into  The bed, or when you just lie down to rest ?                          Yes   Do you usually cough after meals or eating?         no   Do you cough when (or after) you bend over?    no   GERD SCORE     Kouffman Reflux v Neurogenic Cough Differentiator Neurogenic   Do you more-or-less cough all day long? yes   Does change of temperature make you cough? no   Does laughing or chuckling cause you to cough? yes   Do fumes (perfume, automobile fumes, burned  Toast, etc.,) cause you to cough ?      yes   Does speaking,  singing, or talking on the phone cause you to cough   ?               Yes    Neurogenic/Airway score       No obvious other patterns in  day to day or daytime variability or assoc excess/ purulent sputum or mucus plugs or hemoptysis or cp or chest tightness, subjective wheeze or overt sinus or hb symptoms.   Sleeping  without nocturnal  or early am exacerbation  of respiratory  c/o's or need for noct saba. Also denies any obvious fluctuation of symptoms with weather or environmental changes or other aggravating or alleviating factors except as outlined above   No unusual exposure hx or h/o childhood pna/ asthma or knowledge of premature birth.  Current Allergies, Complete Past Medical History, Past Surgical History, Family History, and Social History were reviewed in [employer] record.  ROS  The following are not active complaints unless bolded Hoarseness, sore throat, dysphagia, dental problems, itching, sneezing,  nasal congestion or discharge of excess mucus or purulent secretions, ear ache,   fever, chills, sweats, unintended wt loss or wt gain, classically pleuritic or exertional cp,  orthopnea pnd or arm/hand swelling  or leg swelling, presyncope, palpitations, abdominal pain, anorexia, nausea, vomiting, diarrhea  or change in bowel habits or change in bladder habits, change in stools or change in urine, dysuria, hematuria,  rash, arthralgias, visual complaints, headache, numbness, weakness or ataxia or problems with walking or coordination,  change in mood or  memory.            Past Medical History:  Diagnosis Date   BPV (benign positional vertigo)     Outpatient Medications Prior to Visit  Medication Sig Dispense Refill   albuterol (PROVENTIL HFA) 108 (90 Base) MCG/ACT inhaler Inhale 2 puffs into the lungs every 6 (six) hours as needed for wheezing or shortness of breath.     ALPRAZolam (XANAX) 0.25 MG tablet Take 1 tablet (0.25 mg total) by mouth at  bedtime as needed for anxiety. (Patient not taking: Reported on 10/17/2017) 60 tablet 0                 ibuprofen (ADVIL,MOTRIN) 800 MG tablet Take 1 tablet (800 mg total) by mouth every 8 (eight) hours as needed. (Patient not taking: Reported on 10/17/2017) 60 tablet 2              Objective:     BP 136/64 (BP Location: Left Arm, Patient Position: Sitting, Cuff Size: Normal)    Pulse (!) 109    Temp 98.3 F (36.8 C)    Ht 5' 6.5" (1.689 m)    Wt 178 lb 9.6 oz (81 kg)    SpO2 98%    BMI 28.39 kg/m   SpO2: 98 %  RA  amb wf nad   HEENT: nl dentition, turbinates bilaterally, and oropharynx. Nl external ear canals without cough reflex   NECK :  without JVD/Nodes/TM/ nl carotid upstrokes bilaterally   LUNGS: no acc muscle use,  Nl contour chest which is clear to A and P bilaterally without cough on insp or exp maneuvers   CV:  RRR  no s3 or murmur or increase in P2, and no edema   ABD:  soft and nontender with nl inspiratory excursion in the supine position. No bruits or organomegaly appreciated, bowel sounds nl  MS:  Nl gait/ ext warm without deformities, calf tenderness, cyanosis or clubbing No obvious joint restrictions   SKIN: warm and dry without lesions    NEURO:  alert, approp, nl sensorium with  no motor or cerebellar deficits apparent.       I personally reviewed images and agree with radiology impression as follows:  CXR:   05/12/2018     Assessment   Cough Onset ? 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  While not intuitively obvious, many patients with chronic low grade reflux do not cough until there is a primary insult that disturbs the protective epithelial barrier and exposes sensitive nerve endings.   This is typically viral but can due to PNDS and  either may apply here.     >>>   The point is that once this occurs, it is difficult to eliminate the cycle  using anything but a maximally effective acid suppression regimen at least in the short run, accompanied by an appropriate diet to address non acid GERD and control / eliminate the cough itself for at least 3 days with tramadol and perhaps long term with gabapentin which she has taken in past though only in low doses and never for cough.    Discussed in detail all the  indications, usual  risks and alternatives  relative to the benefits with patient who agrees to proceed with rx as outlined.      If not better p follows rx with arrange televisit and gabapentin trial pushing up to 300 mg qid max if tol   Total time devoted to counseling  > 50 % of initial 60 min office visit:  review case with pt/ discussion of options/alternatives/ personally creating written customized instructions and generating a cyclical cough graphic showing problem and remedy  in presence of pt  then going over those specific  Instructions directly with the pt including how to use all of the meds but in particular covering each new medication in detail and the difference between the maintenance= "automatic" meds and the prns using an action plan format for the latter (If this problem/symptom => do that organization reading Left to right).  Please see AVS from this visit for a full list of these instructions which I personally wrote for this pt and  are unique to this visit.        Sandrea Hughs, MD 06/17/2018

## 2018-06-17 NOTE — Assessment & Plan Note (Signed)
Onset ? 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  While not intuitively obvious, many patients with chronic low grade reflux do not cough until there is a primary insult that disturbs the protective epithelial barrier and exposes sensitive nerve endings.   This is typically viral but can due to PNDS and  either may apply here.     >>>   The point is that once this occurs, it is difficult to eliminate the cycle  using anything but a maximally effective acid suppression regimen at least in the short run, accompanied by an appropriate diet to address non acid GERD and control / eliminate the cough itself for at least 3 days with tramadol and perhaps long term with gabapentin which she has taken in past though only in low doses and never for cough.    Discussed in detail all the  indications, usual  risks and alternatives  relative to the benefits with patient who agrees to proceed with rx as outlined.      If not better p follows rx with arrange televisit and gabapentin trial pushing up to 300 mg qid max if tol   Total time devoted to counseling  > 50 % of initial 60 min office visit:  review case with pt/ discussion of options/alternatives/ personally creating written customized instructions and generating a cyclical cough graphic showing problem and remedy  in presence of pt  then going over those specific  Instructions directly with the pt including how to  use all of the meds but in particular covering each new medication in detail and the difference between the maintenance= "automatic" meds and the prns using an action plan format for the latter (If this problem/symptom => do that organization reading Left to right).  Please see AVS from this visit for a full list of these instructions which I personally wrote for this pt and  are unique to this visit.

## 2018-07-07 ENCOUNTER — Ambulatory Visit: Payer: BLUE CROSS/BLUE SHIELD | Admitting: Internal Medicine

## 2018-08-19 DIAGNOSIS — Z20828 Contact with and (suspected) exposure to other viral communicable diseases: Secondary | ICD-10-CM | POA: Diagnosis not present

## 2018-10-20 ENCOUNTER — Ambulatory Visit
Admission: RE | Admit: 2018-10-20 | Discharge: 2018-10-20 | Disposition: A | Discharge status: Home / Self Care | Payer: BC Managed Care – PPO | Source: Ambulatory Visit | Attending: Obstetrics and Gynecology | Admitting: Obstetrics and Gynecology

## 2018-10-20 ENCOUNTER — Ambulatory Visit
Admission: RE | Admit: 2018-10-20 | Discharge: 2018-10-20 | Disposition: A | Discharge status: Home / Self Care | Payer: BLUE CROSS/BLUE SHIELD | Source: Ambulatory Visit | Attending: Obstetrics and Gynecology | Admitting: Obstetrics and Gynecology

## 2018-10-20 ENCOUNTER — Other Ambulatory Visit: Payer: Self-pay

## 2018-10-20 DIAGNOSIS — R922 Inconclusive mammogram: Secondary | ICD-10-CM | POA: Diagnosis not present

## 2018-10-20 DIAGNOSIS — N6313 Unspecified lump in the right breast, lower outer quadrant: Secondary | ICD-10-CM | POA: Diagnosis not present

## 2018-10-20 DIAGNOSIS — N63 Unspecified lump in unspecified breast: Secondary | ICD-10-CM

## 2018-11-24 DIAGNOSIS — Z6828 Body mass index (BMI) 28.0-28.9, adult: Secondary | ICD-10-CM | POA: Diagnosis not present

## 2018-11-24 DIAGNOSIS — A609 Anogenital herpesviral infection, unspecified: Secondary | ICD-10-CM | POA: Diagnosis not present

## 2018-11-24 DIAGNOSIS — Z01419 Encounter for gynecological examination (general) (routine) without abnormal findings: Secondary | ICD-10-CM | POA: Diagnosis not present

## 2019-02-03 DIAGNOSIS — Z20828 Contact with and (suspected) exposure to other viral communicable diseases: Secondary | ICD-10-CM | POA: Diagnosis not present

## 2019-02-06 DIAGNOSIS — F3281 Premenstrual dysphoric disorder: Secondary | ICD-10-CM | POA: Diagnosis not present

## 2019-02-06 DIAGNOSIS — F430 Acute stress reaction: Secondary | ICD-10-CM | POA: Diagnosis not present

## 2019-05-15 ENCOUNTER — Ambulatory Visit: Payer: BC Managed Care – PPO

## 2019-05-16 ENCOUNTER — Ambulatory Visit: Payer: BC Managed Care – PPO

## 2019-10-01 DIAGNOSIS — J029 Acute pharyngitis, unspecified: Secondary | ICD-10-CM | POA: Diagnosis not present

## 2021-02-18 DIAGNOSIS — U071 COVID-19: Secondary | ICD-10-CM | POA: Diagnosis not present

## 2021-02-27 DIAGNOSIS — U071 COVID-19: Secondary | ICD-10-CM | POA: Diagnosis not present

## 2021-03-09 ENCOUNTER — Other Ambulatory Visit: Payer: Self-pay | Admitting: Obstetrics and Gynecology

## 2021-03-09 DIAGNOSIS — N6310 Unspecified lump in the right breast, unspecified quadrant: Secondary | ICD-10-CM

## 2021-04-21 DIAGNOSIS — M7918 Myalgia, other site: Secondary | ICD-10-CM | POA: Diagnosis not present

## 2021-04-28 ENCOUNTER — Ambulatory Visit
Admission: RE | Admit: 2021-04-28 | Discharge: 2021-04-28 | Disposition: A | Discharge status: Home / Self Care | Payer: BC Managed Care – PPO | Source: Ambulatory Visit | Attending: Obstetrics and Gynecology | Admitting: Obstetrics and Gynecology

## 2021-04-28 ENCOUNTER — Ambulatory Visit
Admission: RE | Admit: 2021-04-28 | Discharge: 2021-04-28 | Disposition: A | Discharge status: Home / Self Care | Payer: Self-pay | Source: Ambulatory Visit | Attending: Obstetrics and Gynecology | Admitting: Obstetrics and Gynecology

## 2021-04-28 DIAGNOSIS — N6310 Unspecified lump in the right breast, unspecified quadrant: Secondary | ICD-10-CM

## 2021-04-28 DIAGNOSIS — R922 Inconclusive mammogram: Secondary | ICD-10-CM | POA: Diagnosis not present

## 2021-04-30 DIAGNOSIS — Z20828 Contact with and (suspected) exposure to other viral communicable diseases: Secondary | ICD-10-CM | POA: Diagnosis not present

## 2021-04-30 DIAGNOSIS — J029 Acute pharyngitis, unspecified: Secondary | ICD-10-CM | POA: Diagnosis not present

## 2021-05-03 DIAGNOSIS — J029 Acute pharyngitis, unspecified: Secondary | ICD-10-CM | POA: Diagnosis not present

## 2021-05-03 DIAGNOSIS — R0982 Postnasal drip: Secondary | ICD-10-CM | POA: Diagnosis not present

## 2021-06-08 DIAGNOSIS — H5711 Ocular pain, right eye: Secondary | ICD-10-CM | POA: Diagnosis not present

## 2021-06-08 DIAGNOSIS — H5789 Other specified disorders of eye and adnexa: Secondary | ICD-10-CM | POA: Diagnosis not present

## 2021-06-14 DIAGNOSIS — S92524A Nondisplaced fracture of medial phalanx of right lesser toe(s), initial encounter for closed fracture: Secondary | ICD-10-CM | POA: Diagnosis not present

## 2021-06-14 DIAGNOSIS — S92514A Nondisplaced fracture of proximal phalanx of right lesser toe(s), initial encounter for closed fracture: Secondary | ICD-10-CM | POA: Diagnosis not present

## 2021-06-21 DIAGNOSIS — S92524D Nondisplaced fracture of medial phalanx of right lesser toe(s), subsequent encounter for fracture with routine healing: Secondary | ICD-10-CM | POA: Diagnosis not present

## 2021-06-21 DIAGNOSIS — S92514D Nondisplaced fracture of proximal phalanx of right lesser toe(s), subsequent encounter for fracture with routine healing: Secondary | ICD-10-CM | POA: Diagnosis not present

## 2021-06-26 IMAGING — MG DIGITAL DIAGNOSTIC BILATERAL MAMMOGRAM WITH TOMO AND CAD
8 series · 8 of 24 positions shown · non-contrast
Comparison: Previous exam(s).

CLINICAL DATA: Follow-up 2 probably benign right breast masses.
Known fibrocystic changes.

EXAM:
DIGITAL DIAGNOSTIC BILATERAL MAMMOGRAM WITH CAD AND TOMO
ULTRASOUND RIGHT BREAST

[R MLO synth-2D]
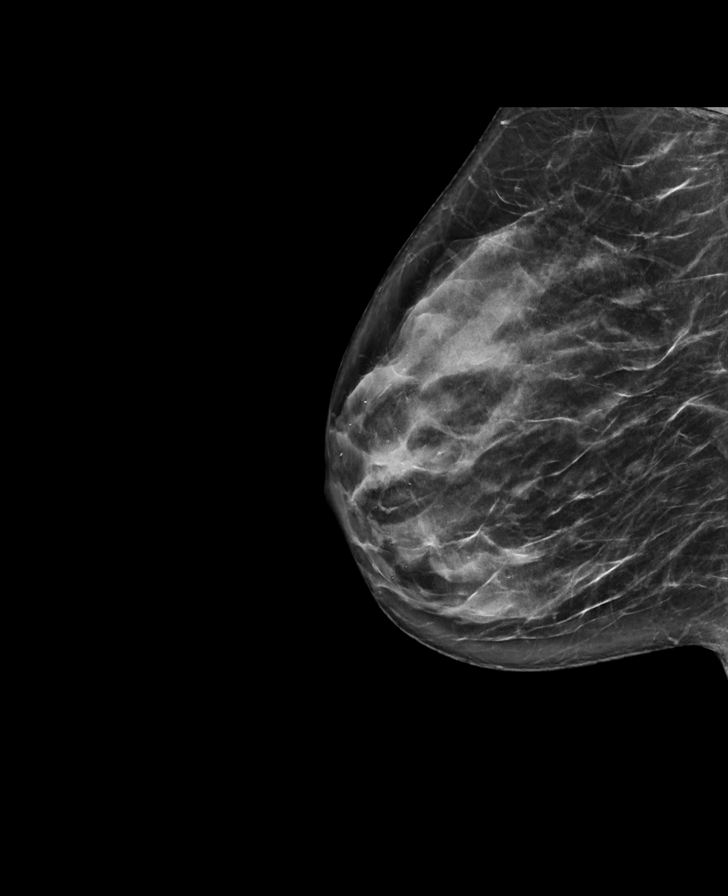

[L CC synth-2D]
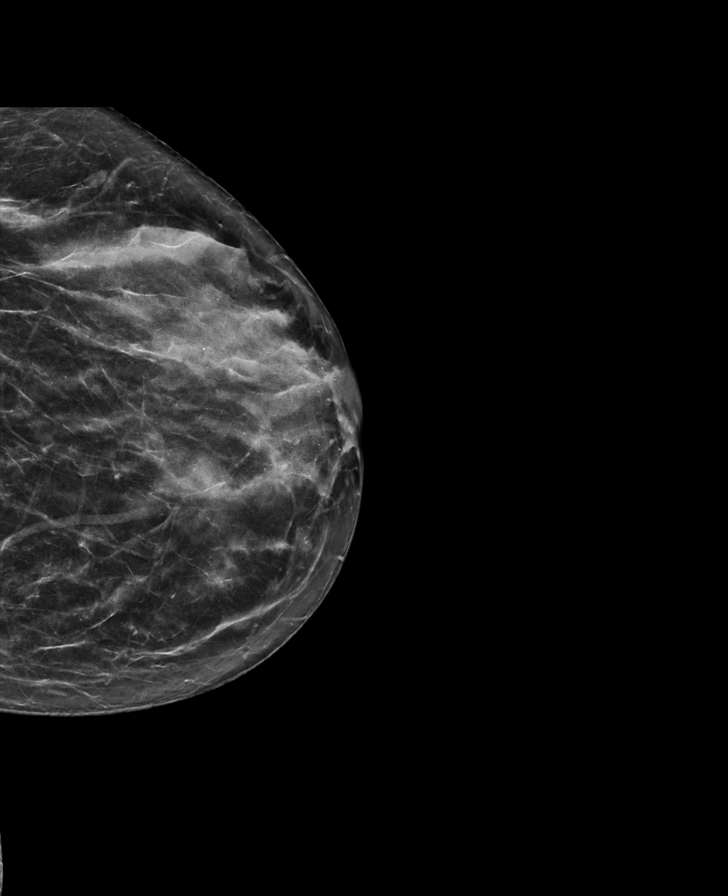

[L MLO synth-2D]
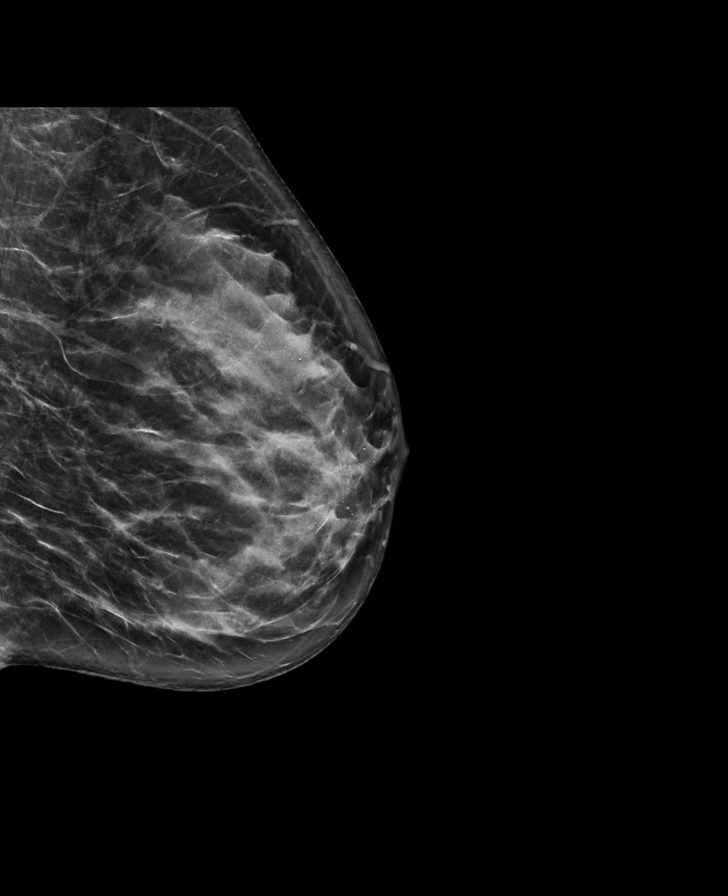

[R CC synth-2D]
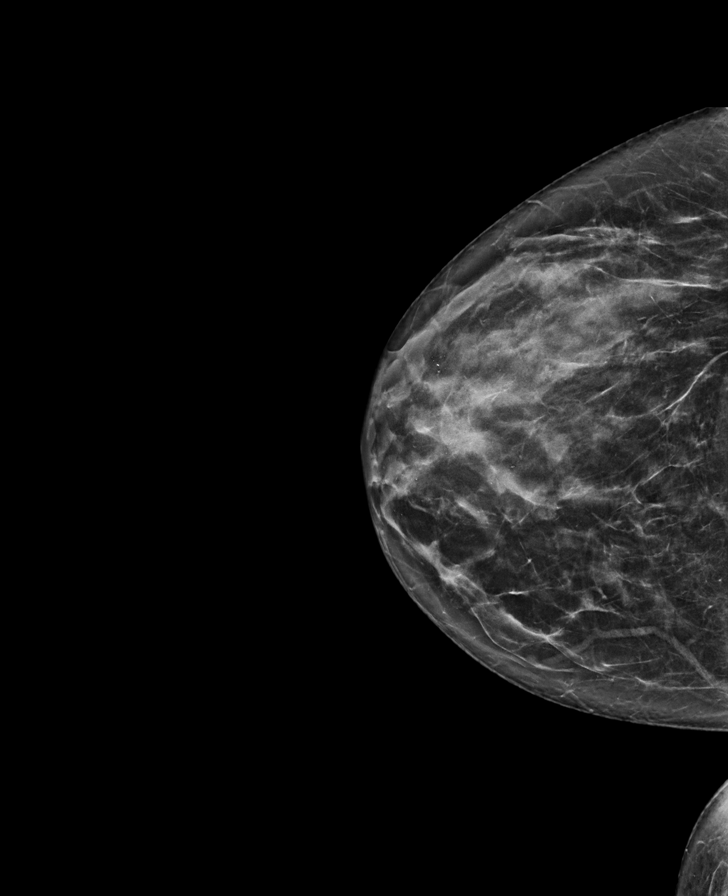

[R CC tomo · tomo slice 36/71.0]
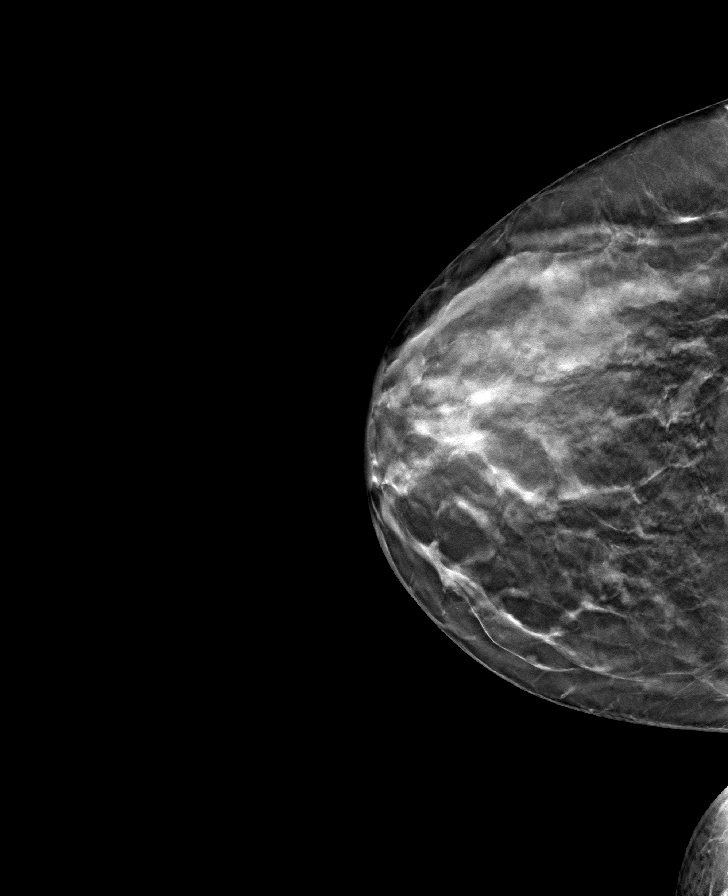

[L CC tomo · tomo slice 33/65.0]
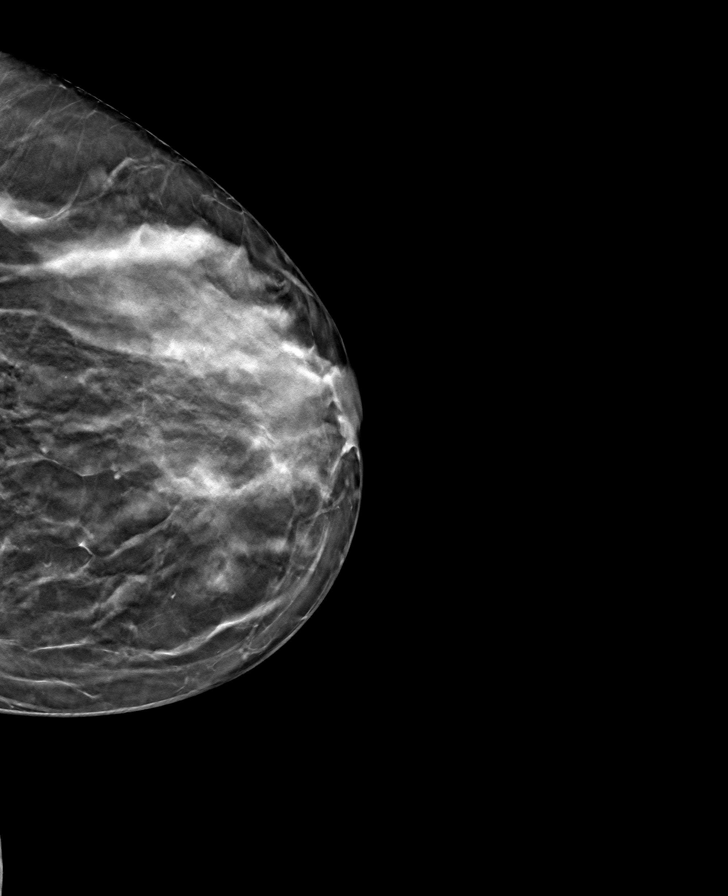

[L MLO tomo · tomo slice 37/73.0]
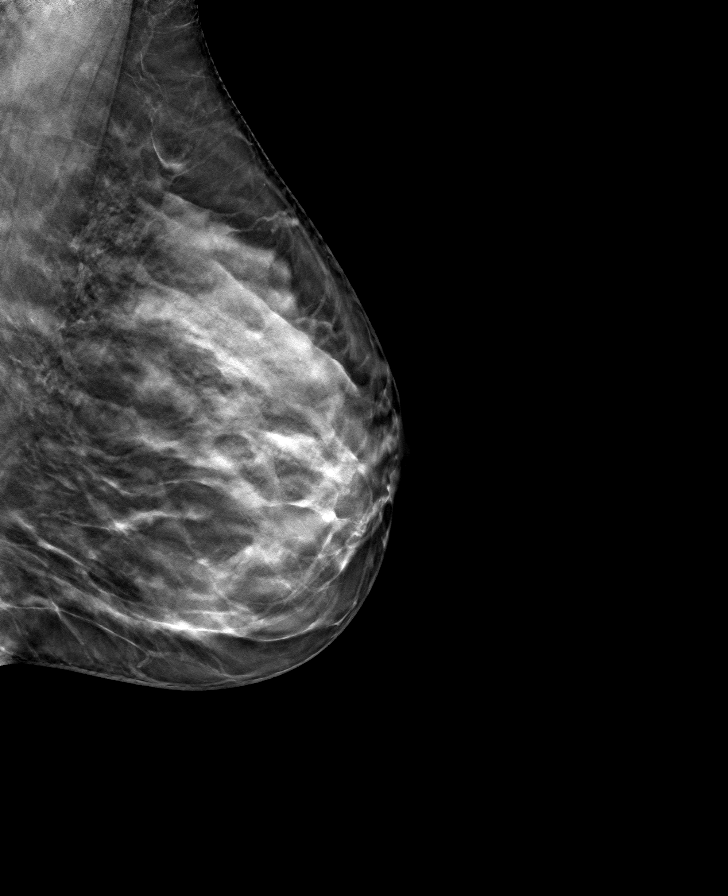

[R MLO tomo · tomo slice 37/73.0]
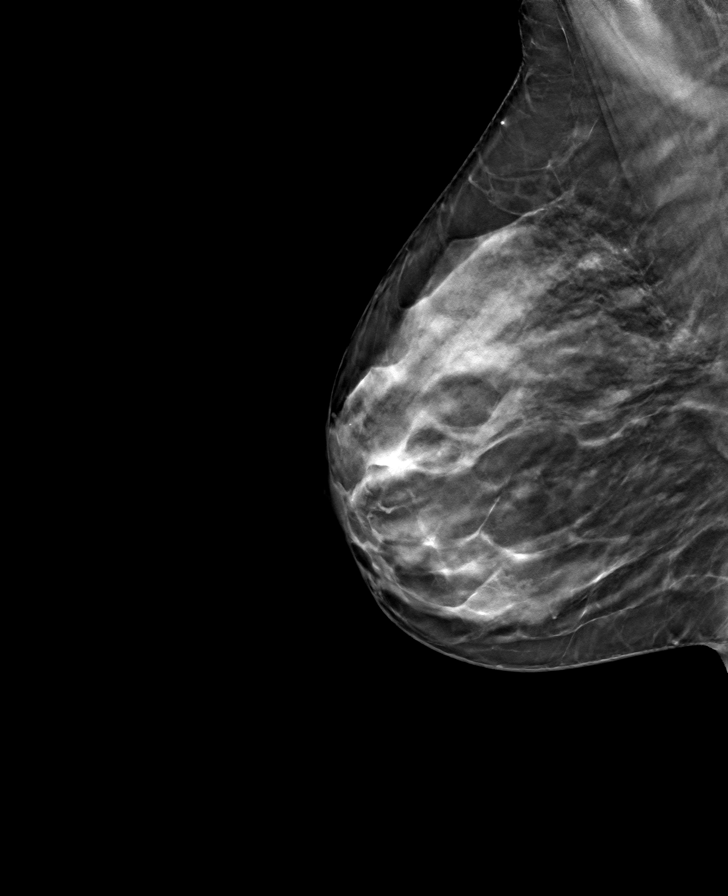

[8 of 24 positions shown; findings below may reference images not displayed]

ACR Breast Density Category c: The breast tissue is heterogeneously
dense, which may obscure small masses.
FINDINGS: The mass in the lateral left breast is stable, proven to be a cyst
on previous imaging. No other suspicious masses, calcifications, or
distortion seen in the left breast.

The obscured mass in the lateral right breast is unchanged
mammographically as well. The sonographically identified mass in the
right subareolar region is not identified mammographically. There is
a new mass at approximately 6 o'clock in the right breast which
appears to be new. This mass is isodense and circumscribed.

Mammographic images were processed with CAD.

On physical exam, no suspicious lumps are identified.

Targeted ultrasound is performed, showing a mass in the right breast
at 8 o'clock, 7 cm from the nipple measuring 6 x 3 x 7 mm, not
significantly changed in the interval. Subareolar mass in the right
breast at 8 o'clock, 1 cm from the nipple measures 5 x 4 x 5 mm, not
significantly changed in the interval. Fibrocystic changes are seen
in the inferior right breast at 6 and 7 o'clock correlating
mammographically identified new mass.
IMPRESSION: Stable probably benign masses in the right breast at 8 o'clock, 7 cm
from the nipple and 8 o'clock, 1 cm from the nipple. Fibrocystic
changes. No evidence of malignancy in the left breast.

RECOMMENDATION:
Twelve month follow-up ultrasound of the probably benign masses in
the right breast. The patient will be due for bilateral mammography
at that time.

I have discussed the findings and recommendations with the patient.
Results were also provided in writing at the conclusion of the
visit. If applicable, a reminder letter will be sent to the patient
regarding the next appointment.

BI-RADS CATEGORY  3: Probably benign.

## 2021-07-27 DIAGNOSIS — Z1329 Encounter for screening for other suspected endocrine disorder: Secondary | ICD-10-CM | POA: Diagnosis not present

## 2021-07-27 DIAGNOSIS — Z01419 Encounter for gynecological examination (general) (routine) without abnormal findings: Secondary | ICD-10-CM | POA: Diagnosis not present

## 2021-07-27 DIAGNOSIS — Z131 Encounter for screening for diabetes mellitus: Secondary | ICD-10-CM | POA: Diagnosis not present

## 2021-07-27 DIAGNOSIS — Z6829 Body mass index (BMI) 29.0-29.9, adult: Secondary | ICD-10-CM | POA: Diagnosis not present

## 2021-07-27 DIAGNOSIS — Z130 Encounter for screening for diseases of the blood and blood-forming organs and certain disorders involving the immune mechanism: Secondary | ICD-10-CM | POA: Diagnosis not present

## 2021-07-27 DIAGNOSIS — Z1321 Encounter for screening for nutritional disorder: Secondary | ICD-10-CM | POA: Diagnosis not present

## 2021-09-14 DIAGNOSIS — F4323 Adjustment disorder with mixed anxiety and depressed mood: Secondary | ICD-10-CM | POA: Diagnosis not present

## 2021-09-28 DIAGNOSIS — F4323 Adjustment disorder with mixed anxiety and depressed mood: Secondary | ICD-10-CM | POA: Diagnosis not present

## 2021-10-10 DIAGNOSIS — F4323 Adjustment disorder with mixed anxiety and depressed mood: Secondary | ICD-10-CM | POA: Diagnosis not present

## 2021-10-19 DIAGNOSIS — H9203 Otalgia, bilateral: Secondary | ICD-10-CM | POA: Diagnosis not present

## 2021-10-19 DIAGNOSIS — H6693 Otitis media, unspecified, bilateral: Secondary | ICD-10-CM | POA: Diagnosis not present

## 2021-10-25 DIAGNOSIS — F4323 Adjustment disorder with mixed anxiety and depressed mood: Secondary | ICD-10-CM | POA: Diagnosis not present

## 2021-11-08 DIAGNOSIS — F4323 Adjustment disorder with mixed anxiety and depressed mood: Secondary | ICD-10-CM | POA: Diagnosis not present

## 2021-11-14 ENCOUNTER — Emergency Department (HOSPITAL_BASED_OUTPATIENT_CLINIC_OR_DEPARTMENT_OTHER)
Admission: EM | Admit: 2021-11-14 | Discharge: 2021-11-14 | Disposition: A | Discharge status: Home / Self Care | Payer: BC Managed Care – PPO | Attending: Emergency Medicine | Admitting: Emergency Medicine

## 2021-11-14 ENCOUNTER — Other Ambulatory Visit (HOSPITAL_BASED_OUTPATIENT_CLINIC_OR_DEPARTMENT_OTHER): Payer: Self-pay

## 2021-11-14 ENCOUNTER — Other Ambulatory Visit: Payer: Self-pay

## 2021-11-14 ENCOUNTER — Emergency Department (HOSPITAL_BASED_OUTPATIENT_CLINIC_OR_DEPARTMENT_OTHER): Payer: BC Managed Care – PPO | Admitting: Radiology

## 2021-11-14 ENCOUNTER — Encounter (HOSPITAL_BASED_OUTPATIENT_CLINIC_OR_DEPARTMENT_OTHER): Payer: Self-pay

## 2021-11-14 DIAGNOSIS — R0789 Other chest pain: Secondary | ICD-10-CM | POA: Insufficient documentation

## 2021-11-14 DIAGNOSIS — R079 Chest pain, unspecified: Secondary | ICD-10-CM | POA: Diagnosis not present

## 2021-11-14 DIAGNOSIS — R072 Precordial pain: Secondary | ICD-10-CM | POA: Diagnosis not present

## 2021-11-14 LAB — BASIC METABOLIC PANEL
Anion gap: 9 (ref 5–15)
BUN: 13 mg/dL (ref 6–20)
CO2: 28 mmol/L (ref 22–32)
Calcium: 9.4 mg/dL (ref 8.9–10.3)
Chloride: 103 mmol/L (ref 98–111)
Creatinine, Ser: 0.89 mg/dL (ref 0.44–1.00)
GFR, Estimated: >60 mL/min (ref >60)
Glucose, Bld: 117 mg/dL — ABNORMAL HIGH (ref 70–99)
Potassium: 3.5 mmol/L (ref 3.5–5.1)
Sodium: 140 mmol/L (ref 135–145)

## 2021-11-14 LAB — CBC
HCT: 40.7 % (ref 36.0–46.0)
Hemoglobin: 13.6 g/dL (ref 12.0–15.0)
MCH: 28.8 pg (ref 26.0–34.0)
MCHC: 33.4 g/dL (ref 30.0–36.0)
MCV: 86.2 fL (ref 80.0–100.0)
Platelets: 178 10*3/uL (ref 150–400)
RBC: 4.72 MIL/uL (ref 3.87–5.11)
RDW: 12.5 % (ref 11.5–15.5)
WBC: 6.2 10*3/uL (ref 4.0–10.5)
nRBC: 0 % (ref 0.0–0.2)

## 2021-11-14 LAB — PREGNANCY, URINE: Preg Test, Ur: NEGATIVE

## 2021-11-14 LAB — D-DIMER, QUANTITATIVE: D-Dimer, Quant: <0.27 ug/mL-FEU (ref 0.00–0.50)

## 2021-11-14 LAB — TROPONIN I (HIGH SENSITIVITY): Troponin I (High Sensitivity): <2 ng/L (ref <18)

## 2021-11-14 NOTE — ED Provider Notes (Signed)
[facility] EMERGENCY DEPT Provider Note   CSN: [id number] Arrival date & time: 11/14/21  1016     History  Chief Complaint  Patient presents with   Chest Pain    Cheryl Hanson is a 41 y.o. female.  Patient with mid chest discomfort since yesterday morning.  Family history of blood clots.  No significant medical history herself.  Not a smoker.  Denies any recent surgery or travel.  Not exertional.  She has been under a lot of stress and thinks that could be related to stress.  No infectious symptoms.  Nothing makes it worse or better.  No abdominal pain, nausea, vomiting, weakness, numbness, vision changes or headaches.  The history is provided by the patient.       Home Medications Prior to Admission medications   Medication Sig Start Date End Date Taking? Authorizing Provider  albuterol (PROVENTIL HFA) 108 (90 Base) MCG/ACT inhaler Inhale 2 puffs into the lungs every 6 (six) hours as needed for wheezing or shortness of breath.    [provider]  ALPRAZolam Prudy Feeler) 0.25 MG tablet Take 1 tablet (0.25 mg total) by mouth at bedtime as needed for anxiety. Patient not taking: Reported on 10/17/2017 04/01/15   Carollee Leitz, RN  famotidine (PEPCID) 20 MG tablet One at bedtime 06/17/18   Nyoka Cowden, MD  ibuprofen (ADVIL,MOTRIN) 800 MG tablet Take 1 tablet (800 mg total) by mouth every 8 (eight) hours as needed. Patient not taking: Reported on 10/17/2017 06/25/17   Tarry Kos, MD  pantoprazole (PROTONIX) 40 MG tablet Take 1 tablet (40 mg total) by mouth daily. Take 30-60 min before first meal of the day 06/17/18   Nyoka Cowden, MD  predniSONE (DELTASONE) 10 MG tablet Take  4 each am x 2 days,   2 each am x 2 days,  1 each am x 2 days and stop 06/17/18   Nyoka Cowden, MD      Allergies    Patient has no known allergies.    Review of Systems   Review of Systems  Physical Exam Updated Vital Signs  ED Triage Vitals  Enc Vitals Group     BP  11/14/21 1029 134/86     Pulse Rate 11/14/21 1029 96     Resp 11/14/21 1029 16     Temp 11/14/21 1031 98.6 F (37 C)     Temp Source 11/14/21 1031 Oral     SpO2 11/14/21 1029 100 %     Weight 11/14/21 1026 180 lb (81.6 kg)     Height 11/14/21 1026 5\' 6"  (1.676 m)     Head Circumference --      Peak Flow --      Pain Score 11/14/21 1029 6     Pain Loc --      Pain Edu? --      Excl. in GC? --     Physical Exam Vitals and nursing note reviewed.  Constitutional:      General: She is not in acute distress.    Appearance: She is well-developed. She is not ill-appearing.  HENT:     Head: Normocephalic and atraumatic.  Eyes:     Extraocular Movements: Extraocular movements intact.     Conjunctiva/sclera: Conjunctivae normal.     Pupils: Pupils are equal, round, and reactive to light.  Cardiovascular:     Rate and Rhythm: Normal rate and regular rhythm.     Pulses:  Radial pulses are 2+ on the right side and 2+ on the left side.     Heart sounds: Normal heart sounds. No murmur heard. Pulmonary:     Effort: Pulmonary effort is normal. No respiratory distress.     Breath sounds: Normal breath sounds.  Abdominal:     Palpations: Abdomen is soft.     Tenderness: There is no abdominal tenderness.  Musculoskeletal:        General: No swelling. Normal range of motion.     Cervical back: Normal range of motion and neck supple.     Right lower leg: No edema.     Left lower leg: No edema.  Skin:    General: Skin is warm and dry.     Capillary Refill: Capillary refill takes less than 2 seconds.  Neurological:     General: No focal deficit present.     Mental Status: She is alert.  Psychiatric:        Mood and Affect: Mood normal.     ED Results / Procedures / Treatments   Labs (all labs ordered are listed, but only abnormal results are displayed) Labs Reviewed  BASIC METABOLIC PANEL - Abnormal; Notable for the following components:      Result Value   Glucose, Bld  117 (*)    All other components within normal limits  CBC  PREGNANCY, URINE  D-DIMER, QUANTITATIVE  TROPONIN I (HIGH SENSITIVITY)    EKG EKG Interpretation  Date/Time:  Tuesday November 14 2021 10:31:22 EDT Ventricular Rate:  83 PR Interval:  130 QRS Duration: 82 QT Interval:  342 QTC Calculation: 401 R Axis:   95 Text Interpretation: Normal sinus rhythm Rightward axis Nonspecific ST abnormality Abnormal ECG No previous ECGs available Confirmed by Lennice Sites ([id number]) on 11/14/2021 10:44:07 AM  Radiology DG Chest 2 View  Result Date: 11/14/2021 CLINICAL DATA:  Midsternal chest pain radiating to the throat EXAM: CHEST - 2 VIEW COMPARISON:  Chest radiograph 05/12/2018 FINDINGS: No pleural effusion. No pneumothorax. No focal airspace opacity. Normal cardiac and mediastinal contours. Visualized upper abdomen is unremarkable. No acute osseous abnormality. IMPRESSION: No active cardiopulmonary disease. Electronically Signed   By: Marin Roberts M.D.   On: 11/14/2021 11:03    Procedures Procedures    Medications Ordered in ED Medications - No data to display  ED Course/ Medical Decision Making/ A&P                           Medical Decision Making Amount and/or Complexity of Data Reviewed Labs: ordered. Radiology: ordered.   Cheryl Hanson is here with chest pain.  Normal vitals.  No fever.  Heart score 1.  Family history of blood clots and will get D-dimer.  EKG shows sinus rhythm.  No ischemic changes.  We will check CBC, BMP, troponin, chest x-ray, D-dimer.  Differential diagnosis is likely reflux versus stress versus MSK but will evaluate for ACS, PE, pneumonia, pneumothorax.  Per my review and interpretation of labs, troponin normal, D-dimer within normal limits, no significant anemia, electrolyte abnormality, kidney injury, leukocytosis.  Overall very atypical story for ACS.  Suspect may be stress/reflux related/MSK type pain.  Recommend rest, Tylenol.  Follow-up with  primary care doctor.  Given return precautions.  This chart was dictated using voice recognition software.  Despite best efforts to proofread,  errors can occur which can change the documentation meaning.         Final Clinical  Impression(s) / ED Diagnoses Final diagnoses:  Atypical chest pain    Rx / DC Orders ED Discharge Orders     None         Virgina Norfolk, DO 11/14/21 1150

## 2021-11-14 NOTE — ED Triage Notes (Signed)
Pt presents POV from home  Woke up yesterday morning mid-sternum chest pain radiating up into her throat. Pt reports a lot of new stressors in her life, came here today to r/o cardiac events

## 2021-11-14 NOTE — Discharge Instructions (Signed)
Follow-up with your primary care doctor.  If symptoms become more persistent and more worrisome consider coming back for reevaluation.

## 2021-11-16 DIAGNOSIS — Z6828 Body mass index (BMI) 28.0-28.9, adult: Secondary | ICD-10-CM | POA: Diagnosis not present

## 2021-11-16 DIAGNOSIS — K219 Gastro-esophageal reflux disease without esophagitis: Secondary | ICD-10-CM | POA: Diagnosis not present

## 2021-11-16 DIAGNOSIS — S23428D Other sprain of sternum, subsequent encounter: Secondary | ICD-10-CM | POA: Diagnosis not present

## 2021-11-22 DIAGNOSIS — F4323 Adjustment disorder with mixed anxiety and depressed mood: Secondary | ICD-10-CM | POA: Diagnosis not present

## 2021-12-26 DIAGNOSIS — F4323 Adjustment disorder with mixed anxiety and depressed mood: Secondary | ICD-10-CM | POA: Diagnosis not present

## 2022-01-12 DIAGNOSIS — F4323 Adjustment disorder with mixed anxiety and depressed mood: Secondary | ICD-10-CM | POA: Diagnosis not present

## 2022-01-22 DIAGNOSIS — F4323 Adjustment disorder with mixed anxiety and depressed mood: Secondary | ICD-10-CM | POA: Diagnosis not present

## 2022-01-31 DIAGNOSIS — F4323 Adjustment disorder with mixed anxiety and depressed mood: Secondary | ICD-10-CM | POA: Diagnosis not present

## 2022-02-13 DIAGNOSIS — F4323 Adjustment disorder with mixed anxiety and depressed mood: Secondary | ICD-10-CM | POA: Diagnosis not present

## 2022-02-28 DIAGNOSIS — F4323 Adjustment disorder with mixed anxiety and depressed mood: Secondary | ICD-10-CM | POA: Diagnosis not present

## 2022-03-14 DIAGNOSIS — F4323 Adjustment disorder with mixed anxiety and depressed mood: Secondary | ICD-10-CM | POA: Diagnosis not present

## 2022-03-28 DIAGNOSIS — F4323 Adjustment disorder with mixed anxiety and depressed mood: Secondary | ICD-10-CM | POA: Diagnosis not present

## 2022-04-04 DIAGNOSIS — F4323 Adjustment disorder with mixed anxiety and depressed mood: Secondary | ICD-10-CM | POA: Diagnosis not present

## 2022-04-11 DIAGNOSIS — F4323 Adjustment disorder with mixed anxiety and depressed mood: Secondary | ICD-10-CM | POA: Diagnosis not present

## 2022-04-25 DIAGNOSIS — F4323 Adjustment disorder with mixed anxiety and depressed mood: Secondary | ICD-10-CM | POA: Diagnosis not present

## 2022-08-21 DIAGNOSIS — R21 Rash and other nonspecific skin eruption: Secondary | ICD-10-CM | POA: Diagnosis not present

## 2022-09-05 DIAGNOSIS — Z01419 Encounter for gynecological examination (general) (routine) without abnormal findings: Secondary | ICD-10-CM | POA: Diagnosis not present

## 2022-09-05 DIAGNOSIS — Z6830 Body mass index (BMI) 30.0-30.9, adult: Secondary | ICD-10-CM | POA: Diagnosis not present

## 2022-09-10 DIAGNOSIS — L74510 Primary focal hyperhidrosis, axilla: Secondary | ICD-10-CM | POA: Diagnosis not present

## 2022-09-10 DIAGNOSIS — L239 Allergic contact dermatitis, unspecified cause: Secondary | ICD-10-CM | POA: Diagnosis not present

## 2022-10-02 DIAGNOSIS — Z1231 Encounter for screening mammogram for malignant neoplasm of breast: Secondary | ICD-10-CM | POA: Diagnosis not present

## 2022-10-08 ENCOUNTER — Other Ambulatory Visit: Payer: Self-pay | Admitting: Obstetrics and Gynecology

## 2022-10-08 DIAGNOSIS — R928 Other abnormal and inconclusive findings on diagnostic imaging of breast: Secondary | ICD-10-CM

## 2022-10-17 ENCOUNTER — Ambulatory Visit
Admission: RE | Admit: 2022-10-17 | Discharge: 2022-10-17 | Disposition: A | Discharge status: Home / Self Care | Payer: BC Managed Care – PPO | Source: Ambulatory Visit | Attending: Obstetrics and Gynecology | Admitting: Obstetrics and Gynecology

## 2022-10-17 ENCOUNTER — Other Ambulatory Visit: Payer: Self-pay | Admitting: Obstetrics and Gynecology

## 2022-10-17 DIAGNOSIS — R928 Other abnormal and inconclusive findings on diagnostic imaging of breast: Secondary | ICD-10-CM | POA: Diagnosis not present

## 2022-10-17 DIAGNOSIS — N6002 Solitary cyst of left breast: Secondary | ICD-10-CM | POA: Diagnosis not present

## 2022-10-17 DIAGNOSIS — N6489 Other specified disorders of breast: Secondary | ICD-10-CM

## 2022-11-16 DIAGNOSIS — Z0000 Encounter for general adult medical examination without abnormal findings: Secondary | ICD-10-CM | POA: Diagnosis not present

## 2022-11-16 DIAGNOSIS — Z13220 Encounter for screening for lipoid disorders: Secondary | ICD-10-CM | POA: Diagnosis not present

## 2022-11-21 ENCOUNTER — Ambulatory Visit (INDEPENDENT_AMBULATORY_CARE_PROVIDER_SITE_OTHER): Payer: BC Managed Care – PPO | Admitting: Sports Medicine

## 2022-11-21 ENCOUNTER — Encounter: Payer: Self-pay | Admitting: Orthopaedic Surgery

## 2022-11-21 ENCOUNTER — Encounter: Payer: Self-pay | Admitting: Sports Medicine

## 2022-11-21 ENCOUNTER — Other Ambulatory Visit: Payer: BC Managed Care – PPO

## 2022-11-21 ENCOUNTER — Ambulatory Visit (INDEPENDENT_AMBULATORY_CARE_PROVIDER_SITE_OTHER): Payer: Self-pay

## 2022-11-21 ENCOUNTER — Ambulatory Visit (INDEPENDENT_AMBULATORY_CARE_PROVIDER_SITE_OTHER): Payer: BC Managed Care – PPO | Admitting: Orthopaedic Surgery

## 2022-11-21 ENCOUNTER — Ambulatory Visit (INDEPENDENT_AMBULATORY_CARE_PROVIDER_SITE_OTHER): Payer: BC Managed Care – PPO

## 2022-11-21 DIAGNOSIS — G8929 Other chronic pain: Secondary | ICD-10-CM

## 2022-11-21 DIAGNOSIS — M25511 Pain in right shoulder: Secondary | ICD-10-CM

## 2022-11-21 DIAGNOSIS — M25561 Pain in right knee: Secondary | ICD-10-CM | POA: Diagnosis not present

## 2022-11-21 DIAGNOSIS — M7501 Adhesive capsulitis of right shoulder: Secondary | ICD-10-CM

## 2022-11-21 MED ORDER — LIDOCAINE HCL 1 % IJ SOLN
2.0000 mL | INTRAMUSCULAR | Status: AC | PRN
  Administered 2022-11-21: 2 mL

## 2022-11-21 MED ORDER — BUPIVACAINE HCL 0.25 % IJ SOLN
2.0000 mL | INTRAMUSCULAR | Status: AC | PRN
  Administered 2022-11-21: 2 mL via INTRA_ARTICULAR

## 2022-11-21 MED ORDER — METHYLPREDNISOLONE ACETATE 40 MG/ML IJ SUSP
80.0000 mg | INTRAMUSCULAR | Status: AC | PRN
  Administered 2022-11-21: 80 mg via INTRA_ARTICULAR

## 2022-11-21 NOTE — Progress Notes (Signed)
Injection

## 2022-11-21 NOTE — Progress Notes (Signed)
Procedure Note  Patient: Cheryl Hanson             Date of Birth: [birth year]           MRN: [id number]             Visit Date: 11/21/2022  Procedures: Visit Diagnoses:  1. Chronic right shoulder pain   2. Adhesive capsulitis of right shoulder    Large Joint Inj: R glenohumeral on 11/21/2022 11:42 AM Indications: pain Details: 22 G 3.5 in needle, ultrasound-guided posterior approach Medications: 2 mL lidocaine 1 %; 2 mL bupivacaine 0.25 %; 80 mg methylPREDNISolone acetate 40 MG/ML Outcome: tolerated well, no immediate complications  US-guided glenohumeral joint injection, right shoulder After discussion on risks/benefits/indications, informed verbal consent was obtained. A timeout was then performed. The patient was positioned lying lateral recumbent on examination table. The patient's shoulder was prepped with betadine and multiple alcohol swabs and utilizing ultrasound guidance, the patient's glenohumeral joint was identified on ultrasound. Using ultrasound guidance a 22-gauge, 3.5 inch needle with a mixture of 2:2:2 cc's lidocaine:bupivicaine:depomedrol was directed from a lateral to medial direction via in-plane technique into the glenohumeral joint with visualization of appropriate spread of injectate into the joint. Patient tolerated the procedure well without immediate complications.      Procedure, treatment alternatives, risks and benefits explained, specific risks discussed. Consent was given by the patient. Immediately prior to procedure a time out was called to verify the correct patient, procedure, equipment, support staff and site/side marked as required. Patient was prepped and draped in the usual sterile fashion.     - I evaluated the patient about 10 minutes post-injection and she had some improvement in pain and range of motion -She will follow-up with me in 2 weeks, if she is not greater than 80% improved we will consider an additional glenohumeral joint injection,  consider large-volume - follow-up with Dr. Roda Shutters then as indicated; I am happy to see them as needed  Madelyn Brunner, DO Primary Care Sports Medicine Physician  [facility] - Orthopedics  This note was dictated using Dragon naturally speaking software and may contain errors in syntax, spelling, or content which have not been identified prior to signing this note.

## 2022-11-21 NOTE — Progress Notes (Signed)
Office Visit Note   Patient: Cheryl Hanson           Date of Birth: 28-Oct-1980           MRN: [id number] Visit Date: 11/21/2022              Requested by: Candice Camp, MD [address] [place],  [place] [address] PCP: Candice Camp, MD   Assessment & Plan: Visit Diagnoses:  1. Chronic right shoulder pain   2. Chronic pain of right knee     Plan: [name] is a 42 year old female with a right frozen shoulder and right knee pain.  For the frozen shoulder we will set her up with a cortisone injection and physical therapy.  She will follow-up with Dr. Shon Baton for another injection as needed.  For the knee I feel that her patella may have subluxed when she was doing the leg press and now she is having some patellar maltracking.  I will send her to physical therapy for quad strengthening.  In the meantime she will wear a knee brace for support.  Follow-up as needed.  Follow-Up Instructions: No follow-ups on file.   Orders:  Orders Placed This Encounter  Procedures   XR KNEE 3 VIEW RIGHT   XR Shoulder Right   Ambulatory referral to Physical Therapy   AMB referral to sports medicine   No orders of the defined types were placed in this encounter.     Procedures: No procedures performed   Clinical Data: No additional findings.   Subjective: Chief Complaint  Patient presents with   Right Shoulder - Pain   Right Knee - Pain    HPI Cheryl Hanson is a 42 year old female who comes in for evaluation of right shoulder pain and limitation for about 3 weeks.  Denies any injuries.  Just woke up with pain.  In regards to the right knee she has had problems for about 3 months.  She went back to the gym was doing a leg press and her knee gave out.  Since then she has had giving way sensation multiple times.  Denies any mechanical symptoms. Review of Systems  Constitutional: Negative.   HENT: Negative.    Eyes: Negative.   Respiratory: Negative.    Cardiovascular: Negative.    Endocrine: Negative.   Musculoskeletal: Negative.   Neurological: Negative.   Hematological: Negative.   Psychiatric/Behavioral: Negative.    All other systems reviewed and are negative.    Objective: Vital Signs: There were no vitals taken for this visit.  Physical Exam Vitals and nursing note reviewed.  Constitutional:      Appearance: She is well-developed.  HENT:     Head: Atraumatic.     Nose: Nose normal.  Eyes:     Extraocular Movements: Extraocular movements intact.  Cardiovascular:     Pulses: Normal pulses.  Pulmonary:     Effort: Pulmonary effort is normal.  Abdominal:     Palpations: Abdomen is soft.  Musculoskeletal:     Cervical back: Neck supple.  Skin:    General: Skin is warm.     Capillary Refill: Capillary refill takes less than 2 seconds.  Neurological:     Mental Status: She is alert. Mental status is at baseline.  Psychiatric:        Behavior: Behavior normal.        Thought Content: Thought content normal.        Judgment: Judgment normal.     Ortho Exam  Exam of the right shoulder shows restricted range of motion in all planes.  Manual muscle testing of the rotator cuff is grossly intact.  Exam of the right shoulder shows no joint effusion or joint line tenderness.  Normal patella tracking and range of motion.  Collaterals and cruciates are stable. Specialty Comments:  No specialty comments available.  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   Cough 06/04/2012   NYSTAGMUS 03/27/2010   GERD 03/27/2010   BENIGN POSITIONAL VERTIGO 12/06/2009   TUBERCULOSIS, HX OF  10/25/2006   Past Medical History:  Diagnosis Date   BPV (benign positional vertigo)     Family History  Problem Relation Age of Onset   Throat cancer Father        non cigarette smoker   Hypertension Father        resolved post weight loss   Heart attack Maternal Grandfather        > 55   COPD Neg Hx    Diabetes Neg Hx    Stroke Neg Hx     Past Surgical History:  Procedure Laterality Date   TONSILLECTOMY AND ADENOIDECTOMY     UMBILICAL HERNIA REPAIR     Social History   Occupational History   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

## 2022-11-29 ENCOUNTER — Encounter: Payer: Self-pay | Admitting: Rehabilitative and Restorative Service Providers"

## 2022-11-29 ENCOUNTER — Ambulatory Visit: Payer: BC Managed Care – PPO | Admitting: Rehabilitative and Restorative Service Providers"

## 2022-11-29 DIAGNOSIS — M25511 Pain in right shoulder: Secondary | ICD-10-CM

## 2022-11-29 DIAGNOSIS — M25611 Stiffness of right shoulder, not elsewhere classified: Secondary | ICD-10-CM | POA: Diagnosis not present

## 2022-11-29 DIAGNOSIS — R262 Difficulty in walking, not elsewhere classified: Secondary | ICD-10-CM

## 2022-11-29 DIAGNOSIS — M6281 Muscle weakness (generalized): Secondary | ICD-10-CM

## 2022-11-29 DIAGNOSIS — M25561 Pain in right knee: Secondary | ICD-10-CM

## 2022-11-29 NOTE — Therapy (Signed)
OUTPATIENT PHYSICAL THERAPY SHOULDER & KNEE EVALUATION   Patient Name: Cheryl Hanson MRN: [id number] DOB:09/11/1980, 42 y.o., female Today's Date: 11/29/2022  END OF SESSION:  PT End of Session - 11/29/22 1222     Visit Number 1    Number of Visits 12    Date for PT Re-Evaluation 01/24/23    Authorization Type BCBS    Authorization - Visit Number 1    Authorization - Number of Visits 15    Progress Note Due on Visit 15    PT Start Time 0804    PT Stop Time 0848    PT Time Calculation (min) 44 min    Activity Tolerance Patient tolerated treatment well;No increased pain;Patient limited by pain    Behavior During Therapy [facility] for tasks assessed/performed             Past Medical History:  Diagnosis Date   BPV (benign positional vertigo)    Past Surgical History:  Procedure Laterality Date   TONSILLECTOMY AND ADENOIDECTOMY     UMBILICAL HERNIA REPAIR     Patient Active Problem List   Diagnosis Date Noted   GAD (generalized anxiety disorder) 04/05/2015   Panic attacks 04/05/2015   Anxiety with somatization 04/05/2015   Cough 06/04/2012   Nystagmus 03/27/2010   GERD 03/27/2010   BENIGN POSITIONAL VERTIGO 12/06/2009   TUBERCULOSIS, HX OF 10/25/2006    PCP: Candice Camp, MD  REFERRING PROVIDER: Tarry Kos, MD  REFERRING DIAG: Diagnosis M25.511,G89.29 (ICD-10-CM) - Chronic right shoulder pain M25.561,G89.29 (ICD-10-CM) - Chronic pain of right knee  THERAPY DIAG:  Muscle weakness (generalized)  Difficulty in walking, not elsewhere classified  Stiffness of right shoulder, not elsewhere classified  Right shoulder pain, unspecified chronicity  Right knee pain, unspecified chronicity  Rationale for Evaluation and Treatment: Rehabilitation  ONSET DATE: 3-4 months  SUBJECTIVE:                                                                                                                                                                                       SUBJECTIVE STATEMENT: Cheryl Hanson notes right knee pain started with starting to lift weights (leg press).  She typically walks for exercise.  Up stairs is difficult, she has to go up with the left leg first.  She woke up one morning with shoulder pain.  She noticed the right shoulder was getting stiff.  She has a genetic disposition for frozen shoulder.  Her knee has "given out" on the stairs before.  She is "OK" on flat surfaces but notes trouble with stairs.    Hand dominance: Right  PERTINENT HISTORY: BPPV  PAIN:  Are you  having pain? Yes: NPRS scale: Right shoulder 0-3/10 and right knee 0-5/10 over the past week on a 10/10 Pain location: Right shoulder and knee Pain description: Shoulder is more stiffness and knee is weak with some sharp pain with stairs Aggravating factors: Stairs with the knee and limited movement with the right shoulders Relieving factors: Avoids doing stairs or challenging the right shoulder too much  PRECAUTIONS: None  RED FLAGS: None   WEIGHT BEARING RESTRICTIONS: No  FALLS:  Has patient fallen in last 6 months? 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  DIAGNOSTIC FINDINGS:  Rt Shoulder: 3 view xrays show no acute or structural abnormalities  Rt knee: X-rays of the right knee show no acute or structural abnormalities  PATIENT SURVEYS:  FOTO 48 (Goal 65 in 11 visits)  COGNITION: Overall cognitive status: Within functional limits for tasks assessed     SENSATION: No peripheral pain or paresthesias  POSTURE: Mild IR and  protracted shoulders  UPPER & LOWER EXTREMITY ROM:   Active ROM Left/Right 11/29/2022   Shoulder flexion 170/130   Shoulder extension    Shoulder abduction    Shoulder horizontal adduction 50/15   Shoulder internal rotation 75/20   Shoulder external rotation 100/40   Knee flexion 137/134   Knee extension Hyper-extension bilateral   Wrist flexion    Wrist extension    Wrist ulnar deviation    Wrist radial deviation    Wrist pronation    Wrist supination    (Blank rows = not tested)  STRENGTH:  STRENGTH in pounds assessed by hand-held dynamometer  Left/Right 11/29/2022   Shoulder flexion    Shoulder extension    Shoulder abduction    Shoulder adduction    Shoulder internal rotation 31.8/24.7   Shoulder external rotation 21.4/17.4   Middle trapezius    Lower trapezius    Elbow flexion    Knee extension 69.7/58.6   Wrist flexion    Wrist extension    Wrist ulnar deviation    Wrist radial deviation    Wrist pronation    Wrist supination    Grip strength (lbs)    (Blank rows = not tested)    TODAY'S TREATMENT:                                                                                                                                         DATE: 11/29/2022 Supine arm raises 10 x 3 seconds Shoulder ER/IR stretch 10 x 10 seconds each Quad sets with pillow under  knees 10 x 5 seconds  PATIENT EDUCATION: Education details: Reviewed exam findings and HEP Person educated: Patient Education method: Explanation, Demonstration, Tactile cues, Verbal cues, and Handouts Education comprehension: verbalized understanding, returned demonstration, verbal cues required, tactile cues required, and needs further education  HOME EXERCISE PROGRAM: [id number]  ASSESSMENT:  CLINICAL IMPRESSION: Patient is a 42 y.o. female who was seen today for physical therapy evaluation and treatment for Diagnosis M25.511,G89.29 (ICD-10-CM) - Chronic right shoulder pain M25.561,G89.29 (ICD-10-CM) -  Chronic pain of right knee.  Cheryl Hanson has a family history of frozen shoulder and this appears to be what is affecting her currently.  She has also had right knee pain since starting to lift weights.  Exam today showed global capsular flexibility of the right shoulder and significant quadriceps weakness bilaterally.  She was started on a beginner home exercise program to improve right shoulder active range of motion and bilateral quadriceps strength to address impairments noted today.  Her prognosis is good to meet the below listed goals with the recommended plan of care.  OBJECTIVE IMPAIRMENTS: Abnormal gait, decreased activity tolerance, decreased endurance, decreased knowledge of condition, difficulty walking, decreased ROM, decreased strength, impaired perceived functional ability, impaired flexibility, impaired UE functional use, and pain.   ACTIVITY LIMITATIONS: carrying, lifting, standing, squatting, sleeping, stairs, reach over head, and locomotion level  PARTICIPATION LIMITATIONS: community activity and occupation  PERSONAL FACTORS: 1 comorbidity: BPPV  is also affecting patient's functional outcome.   REHAB POTENTIAL: Good  CLINICAL DECISION MAKING: Stable/uncomplicated  EVALUATION COMPLEXITY: Low   GOALS: Goals reviewed with patient? Yes  SHORT TERM GOALS: Target date: 12/27/2022  Terry will be independent with her day 1 home exercise program Baseline: Started 11/29/2022 Goal status: INITIAL  2.  Improve right shoulder active range of motion for flexion to 150 degrees, ER 70 degrees, IR to 45 degrees and horizontal adduction to 20 degrees Baseline: 130, 40, 20 and 15 respectively Goal status: INITIAL  3.  Improve bilateral quadriceps strength to at least 75 pounds Baseline: Less than 70 left and less than 60 right Goal status: INITIAL   LONG TERM GOALS: Target date: 01/18/2023  Improve FOTO 65 in 11 visits Baseline: 48 Goal status: INITIAL  2.  Tyannah will report  right knee and shoulder pain consistently 0-2/10 on the numeric pain rating scale Baseline: 0-5/10 Goal status: INITIAL  3.  Improve right shoulder AROM to 90% or better as compared to the uninvolved left Baseline: See objective Goal status: INITIAL  4.  Improve right shoulder strength to 100% or better as compared to the uninvolved left Baseline: See objective Goal status: INITIAL  5.  Improve bilateral quadriceps strength as assessed by FOTO, objective measures and return to normal activities including stairs without limitation Baseline: Difficulty with stairs, other data available above Goal status: INITIAL  6.  Gracieann will be independent with her long-term home exercise program at discharge Baseline: Started 11/29/2022 Goal status: INITIAL  PLAN:  PT FREQUENCY: 1-2x/week  PT DURATION: 8 weeks  PLANNED INTERVENTIONS: Therapeutic exercises, Therapeutic activity, Neuromuscular re-education, Balance training, Gait training, Patient/Family education, Self Care, Joint mobilization, Stair training, Dry Needling, Cryotherapy, Moist heat, Vasopneumatic device, and Manual therapy  PLAN FOR NEXT SESSION: Review day 1 home exercise program with appropriate capsular flexibility stretching and quadriceps strengthening progressions to meet long-term goals.   Cherlyn Cushing, PT, MPT 11/29/2022, 4:44 PM

## 2022-12-05 ENCOUNTER — Ambulatory Visit: Payer: BC Managed Care – PPO | Admitting: Sports Medicine

## 2022-12-06 ENCOUNTER — Ambulatory Visit (INDEPENDENT_AMBULATORY_CARE_PROVIDER_SITE_OTHER): Payer: BC Managed Care – PPO | Admitting: Rehabilitative and Restorative Service Providers"

## 2022-12-06 ENCOUNTER — Encounter: Payer: Self-pay | Admitting: Rehabilitative and Restorative Service Providers"

## 2022-12-06 DIAGNOSIS — R262 Difficulty in walking, not elsewhere classified: Secondary | ICD-10-CM | POA: Diagnosis not present

## 2022-12-06 DIAGNOSIS — M25611 Stiffness of right shoulder, not elsewhere classified: Secondary | ICD-10-CM | POA: Diagnosis not present

## 2022-12-06 DIAGNOSIS — M25511 Pain in right shoulder: Secondary | ICD-10-CM | POA: Diagnosis not present

## 2022-12-06 DIAGNOSIS — M6281 Muscle weakness (generalized): Secondary | ICD-10-CM

## 2022-12-06 DIAGNOSIS — M25561 Pain in right knee: Secondary | ICD-10-CM

## 2022-12-06 NOTE — Therapy (Signed)
OUTPATIENT PHYSICAL THERAPY SHOULDER & KNEE TREATMENT   Patient Name: Cheryl Hanson MRN: [id number] DOB:Jul 08, 1980, 42 y.o., female Today's Date: 12/06/2022  END OF SESSION:  PT End of Session - 12/06/22 0846     Visit Number 2    Number of Visits 12    Date for PT Re-Evaluation 01/24/23    Authorization Type BCBS    Authorization - Visit Number 2    Authorization - Number of Visits 15    Progress Note Due on Visit 15    PT Start Time 0845    PT Stop Time 0927    PT Time Calculation (min) 42 min    Activity Tolerance Patient tolerated treatment well;No increased pain;Patient limited by pain    Behavior During Therapy [facility] for tasks assessed/performed              Past Medical History:  Diagnosis Date   BPV (benign positional vertigo)    Past Surgical History:  Procedure Laterality Date   TONSILLECTOMY AND ADENOIDECTOMY     UMBILICAL HERNIA REPAIR     Patient Active Problem List   Diagnosis Date Noted   GAD (generalized anxiety disorder) 04/05/2015   Panic attacks 04/05/2015   Anxiety with somatization 04/05/2015   Cough 06/04/2012   Nystagmus 03/27/2010   GERD 03/27/2010   BENIGN POSITIONAL VERTIGO 12/06/2009   TUBERCULOSIS, HX OF 10/25/2006    PCP: Candice Camp, MD  REFERRING PROVIDER: Tarry Kos, MD  REFERRING DIAG: Diagnosis M25.511,G89.29 (ICD-10-CM) - Chronic right shoulder pain M25.561,G89.29 (ICD-10-CM) - Chronic pain of right knee  THERAPY DIAG:  Muscle weakness (generalized)  Difficulty in walking, not elsewhere classified  Stiffness of right shoulder, not elsewhere classified  Right shoulder pain, unspecified chronicity  Right knee pain, unspecified chronicity  Rationale for Evaluation and Treatment: Rehabilitation  ONSET DATE: 3-4 months  SUBJECTIVE:                                                                                                                                                                                       SUBJECTIVE STATEMENT: Cheryl Hanson notes good HEP compliance.  She notes the cortisone shot is wearing off with the right shoulder.   Cheryl Hanson notes right knee pain started with starting to lift weights (leg press).  She typically walks for exercise.  Up stairs is difficult, she has to go up with the left leg first.  She woke up one morning with shoulder pain.  She noticed the right shoulder was getting stiff.  She has a genetic disposition for frozen shoulder.  Her knee has "given out" on the stairs before.  She is "OK" on flat  surfaces but notes trouble with stairs.    Hand dominance: Right  PERTINENT HISTORY: BPPV  PAIN:  Are you having pain? Yes: NPRS scale: Right shoulder 0-3/10 and right knee 0-5/10 over the past week on a 10/10 Pain location: Right shoulder and knee Pain description: Shoulder is more stiffness and knee is weak with some sharp pain with stairs Aggravating factors: Stairs with the knee and limited movement with the right shoulders Relieving factors: Avoids doing stairs or challenging the right shoulder too much  PRECAUTIONS: None  RED FLAGS: None   WEIGHT BEARING RESTRICTIONS: No  FALLS:  Has patient fallen in last 6 months? 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  DIAGNOSTIC FINDINGS:  Rt Shoulder: 3 view xrays show no acute or structural abnormalities  Rt knee: X-rays of the right knee show no acute or structural abnormalities  PATIENT SURVEYS:  FOTO 48 (Goal 65 in 11 visits)  COGNITION: Overall cognitive status: Within functional  limits for tasks assessed     SENSATION: No peripheral pain or paresthesias  POSTURE: Mild IR and protracted shoulders  UPPER & LOWER EXTREMITY ROM:   Active ROM Left/Right 11/29/2022 Right 12/06/2022  Shoulder flexion 170/130   Shoulder extension    Shoulder abduction    Shoulder horizontal adduction 50/15   Shoulder internal rotation 75/20 30  Shoulder external rotation 100/40 55  Knee flexion 137/134   Knee extension Hyper-extension bilateral   Wrist flexion    Wrist extension    Wrist ulnar deviation    Wrist radial deviation    Wrist pronation    Wrist supination    (Blank rows = not tested)  STRENGTH:  STRENGTH in pounds assessed by hand-held dynamometer  Left/Right 11/29/2022   Shoulder flexion    Shoulder extension    Shoulder abduction    Shoulder adduction    Shoulder internal rotation 31.8/24.7   Shoulder external rotation 21.4/17.4   Middle trapezius    Lower trapezius    Elbow flexion    Knee extension 69.7/58.6   Wrist flexion    Wrist extension    Wrist ulnar deviation    Wrist radial deviation    Wrist pronation    Wrist supination    Grip strength (lbs)    (Blank rows = not tested)    TODAY'S TREATMENT:  DATE:  12/06/2022 Recumbent bike Seat 5 for 5 minutes Level 4 Supine arm raises 20 x 3 seconds 3# Shoulder ER/IR stretch 10 x 10 seconds each Quad sets with pillow under knees 10 x 5 seconds Seated straight leg raises 3 sets of 5 for 3 seconds Shoulder posterior capsule stretch 10 x 10 seconds  Functional Activities: Double Leg Press 75# avoid hyper-extension and slow eccentrics 10 x Single Leg Press 50# avoid hyper-extension and slow eccentrics 10 x   11/29/2022 Supine arm raises 10 x 3 seconds Shoulder ER/IR stretch 10 x 10 seconds each Quad sets with pillow under knees 10 x 5 seconds  PATIENT  EDUCATION: Education details: Reviewed exam findings and HEP Person educated: Patient Education method: Explanation, Demonstration, Tactile cues, Verbal cues, and Handouts Education comprehension: verbalized understanding, returned demonstration, verbal cues required, tactile cues required, and needs further education  HOME EXERCISE PROGRAM: [id number]  ASSESSMENT:  CLINICAL IMPRESSION: Cheryl Hanson reports and demonstrates good early HEP compliance.  She is already making objective progress with her shoulder active range of motion and capsular flexibility.  We also worked on progressions for her quadriceps strength to address her patellar tracking and knee pain issues.  Dejai will benefit from the full recommended course of rehabilitation and her prognosis to meet long-term goals is good with the recommended plan of care.   Patient is a 42 y.o. female who was seen today for physical therapy evaluation and treatment for Diagnosis M25.511,G89.29 (ICD-10-CM) - Chronic right shoulder pain M25.561,G89.29 (ICD-10-CM) - Chronic pain of right knee.  Margert has a family history of frozen shoulder and this appears to be what is affecting her currently.  She has also had right knee pain since starting to lift weights.  Exam today showed global capsular flexibility of the right shoulder and significant quadriceps weakness bilaterally.  She was started on a beginner home exercise program to improve right shoulder active range of motion and bilateral quadriceps strength to address impairments noted today.  Her prognosis is good to meet the below listed goals with the recommended plan of care.  OBJECTIVE IMPAIRMENTS: Abnormal gait, decreased activity tolerance, decreased endurance, decreased knowledge of condition, difficulty walking, decreased ROM, decreased strength, impaired perceived functional ability, impaired flexibility, impaired UE functional use, and pain.   ACTIVITY LIMITATIONS: carrying, lifting,  standing, squatting, sleeping, stairs, reach over head, and locomotion level  PARTICIPATION LIMITATIONS: community activity and occupation  PERSONAL FACTORS: 1 comorbidity: BPPV  is also affecting patient's functional outcome.   REHAB POTENTIAL: Good  CLINICAL DECISION MAKING: Stable/uncomplicated  EVALUATION COMPLEXITY: Low   GOALS: Goals reviewed with patient? Yes  SHORT TERM GOALS: Target date: 12/27/2022  Zaray will be independent with her day 1 home exercise program Baseline: Started 11/29/2022 Goal status: On Going 12/06/2022  2.  Improve right shoulder active range of motion for flexion to 150 degrees, ER 70 degrees, IR to 45 degrees and horizontal adduction to 20 degrees Baseline: 130, 40, 20 and 15 respectively Goal status: On Going 12/06/2022  3.  Improve bilateral quadriceps strength to at least 75 pounds Baseline: Less than 70 left and less than 60 right Goal status: INITIAL   LONG TERM GOALS: Target date: 01/18/2023  Improve FOTO 65 in 11 visits Baseline: 48 Goal status: INITIAL  2.  Derica will report right knee and shoulder pain consistently 0-2/10 on the numeric pain rating scale Baseline: 0-5/10 Goal status: INITIAL  3.  Improve right shoulder AROM to 90% or better as compared to the uninvolved  left Baseline: See objective Goal status: INITIAL  4.  Improve right shoulder strength to 100% or better as compared to the uninvolved left Baseline: See objective Goal status: INITIAL  5.  Improve bilateral quadriceps strength as assessed by FOTO, objective measures and return to normal activities including stairs without limitation Baseline: Difficulty with stairs, other data available above Goal status: INITIAL  6.  Maddilyn will be independent with her long-term home exercise program at discharge Baseline: Started 11/29/2022 Goal status: INITIAL  PLAN:  PT FREQUENCY: 1-2x/week  PT DURATION: 8 weeks  PLANNED INTERVENTIONS: Therapeutic  exercises, Therapeutic activity, Neuromuscular re-education, Balance training, Gait training, Patient/Family education, Self Care, Joint mobilization, Stair training, Dry Needling, Cryotherapy, Moist heat, Vasopneumatic device, and Manual therapy  PLAN FOR NEXT SESSION: Review home exercise program with appropriate capsular flexibility stretching (thumb up the back, possibly flexion with decompress) and quadriceps strengthening (patellar tracking) progressions to meet long-term goals.   Cherlyn Cushing, PT, MPT 12/06/2022, 1:11 PM

## 2022-12-13 ENCOUNTER — Encounter: Payer: BC Managed Care – PPO | Admitting: Rehabilitative and Restorative Service Providers"

## 2022-12-14 ENCOUNTER — Encounter: Payer: BC Managed Care – PPO | Admitting: Rehabilitative and Restorative Service Providers"

## 2022-12-20 ENCOUNTER — Encounter: Payer: BC Managed Care – PPO | Admitting: Rehabilitative and Restorative Service Providers"

## 2022-12-21 ENCOUNTER — Encounter: Payer: BC Managed Care – PPO | Admitting: Rehabilitative and Restorative Service Providers"

## 2022-12-26 NOTE — Therapy (Addendum)
OUTPATIENT PHYSICAL THERAPY SHOULDER & KNEE TREATMENT  / DISCHARGE   Patient Name: Cheryl Hanson MRN: [id number] DOB:[birth year], 42 y.o., female Today's Date: 12/27/2022  END OF SESSION:  PT End of Session - 12/27/22 1223     Visit Number 3    Number of Visits 12    Date for PT Re-Evaluation 01/24/23    Authorization Type [employer]    Authorization - Number of Visits 15    Progress Note Due on Visit 15    PT Start Time 1225    PT Stop Time 1310    PT Time Calculation (min) 45 min    Activity Tolerance Patient tolerated treatment well;No increased pain;Patient limited by pain    Behavior During Therapy [facility] for tasks assessed/performed               Past Medical History:  Diagnosis Date   BPV (benign positional vertigo)    Past Surgical History:  Procedure Laterality Date   TONSILLECTOMY AND ADENOIDECTOMY     UMBILICAL HERNIA REPAIR     Patient Active Problem List   Diagnosis Date Noted   GAD (generalized anxiety disorder) 04/05/2015   Panic attacks 04/05/2015   Anxiety with somatization 04/05/2015   Cough 06/04/2012   Nystagmus 03/27/2010   GERD 03/27/2010   BENIGN POSITIONAL VERTIGO 12/06/2009   TUBERCULOSIS, HX OF 10/25/2006    PCP: Candice Camp, MD  REFERRING PROVIDER: Tarry Kos, MD  REFERRING DIAG: Diagnosis M25.511,G89.29 (ICD-10-CM) - Chronic right shoulder pain M25.561,G89.29 (ICD-10-CM) - Chronic pain of right knee  THERAPY DIAG:  Muscle weakness (generalized)  Stiffness of right shoulder, not elsewhere classified  Right shoulder pain, unspecified chronicity  Right knee pain, unspecified chronicity  Rationale for Evaluation and Treatment: Rehabilitation  ONSET DATE: 3-4 months  SUBJECTIVE:                                                                                                                                                                                      SUBJECTIVE STATEMENT: My knee pain started after I was doing the leg  press at the gym one day, I felt it in my knee and it started getting worse. But now the knee is better. 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   She notes good HEP compliance but feels that sometimes she does over do it.  She notes the cortisone shot is wearing off with the right shoulder.  Hand dominance: Right  PERTINENT HISTORY: BPPV  PAIN:  Are you having pain? Yes: NPRS scale: Right shoulder 5/10 and right knee 0/10 Pain  location: Right shoulder and knee Pain description: Shoulder is more stiffness and knee is weak with some sharp pain with stairs Aggravating factors: Stairs with the knee and limited movement with the right shoulders Relieving factors: Avoids doing stairs or challenging the right shoulder too much  PRECAUTIONS: None  RED FLAGS: None   WEIGHT BEARING RESTRICTIONS: No  FALLS:  Has patient fallen in last 6 months? 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  DIAGNOSTIC FINDINGS:  Rt Shoulder: 3 view xrays show no acute or structural abnormalities  Rt knee: X-rays of the right knee show no acute or structural abnormalities  PATIENT SURVEYS:  FOTO 48 (Goal 65 in 11 visits)  COGNITION: Overall cognitive status: Within functional limits for tasks assessed     SENSATION: No peripheral pain or paresthesias  POSTURE: Mild IR and protracted shoulders  UPPER & LOWER EXTREMITY ROM:   Active ROM Left/Right 11/29/2022 Right 12/06/2022  Shoulder flexion 170/130   Shoulder  extension    Shoulder abduction    Shoulder horizontal adduction 50/15   Shoulder internal rotation 75/20 30  Shoulder external rotation 100/40 55  Knee flexion 137/134   Knee extension Hyper-extension bilateral   Wrist flexion    Wrist extension    Wrist ulnar deviation    Wrist radial deviation    Wrist pronation    Wrist supination    (Blank rows = not tested)  STRENGTH:  STRENGTH in pounds assessed by hand-held dynamometer  Left/Right 11/29/2022   Shoulder flexion    Shoulder extension    Shoulder abduction    Shoulder adduction    Shoulder internal rotation 31.8/24.7   Shoulder external rotation 21.4/17.4   Middle trapezius    Lower trapezius    Elbow flexion    Knee extension 69.7/58.6   Wrist flexion    Wrist extension    Wrist ulnar deviation    Wrist radial deviation    Wrist pronation    Wrist supination    Grip strength (lbs)    (Blank rows = not tested)    TODAY'S TREATMENT:                                                                                                                                         DATE:  12/27/22 NuStep L5x24mins  Wall slides  Finger ladder x5 AAROM shoulder flexion, abduction, behind the back extension and IR x10 PROM to R shoulder all directions  Leg ext 10# 2x10 HS curls 25# 2x10   12/06/2022  Recumbent bike Seat 5 for 5 minutes Level 4 Supine arm raises 20 x 3 seconds 3# Shoulder ER/IR stretch 10 x 10 seconds each Quad sets with pillow under knees 10 x 5 seconds Seated straight leg raises 3 sets of 5 for 3 seconds Shoulder posterior capsule stretch 10 x 10 seconds  Functional Activities: Double Leg Press 75# avoid hyper-extension and slow eccentrics 10 x Single Leg Press 50# avoid hyper-extension and slow eccentrics 10 x   11/29/2022 Supine arm raises 10 x 3 seconds Shoulder ER/IR stretch 10 x 10 seconds each Quad sets with pillow under knees 10 x 5 seconds  PATIENT EDUCATION: Education details: Reviewed exam  findings and HEP Person educated: Patient Education method: Explanation, Demonstration, Tactile cues, Verbal cues, and Handouts Education comprehension: verbalized understanding, returned demonstration, verbal cues required, tactile cues required, and needs further education  HOME EXERCISE PROGRAM: [id number]  ASSESSMENT:  CLINICAL IMPRESSION: Esraa reports and demonstrates good early HEP compliance.  She reports some progress with her shoulder active range of motion and capsular flexibility. Has some muscle guarding with PROM and limitations mostly in ER/IR and abduction. Will benefit from more stretching and AAROM to gain as much range as possible. Ended session working on some strengthening for her knees. Emary will benefit from the full recommended course of rehabilitation and her prognosis to meet long-term goals is good with the recommended plan of care.   OBJECTIVE IMPAIRMENTS: Abnormal gait, decreased activity tolerance, decreased endurance, decreased knowledge of condition, difficulty walking, decreased ROM, decreased strength, impaired perceived functional ability, impaired flexibility, impaired UE functional use, and pain.   ACTIVITY LIMITATIONS: carrying, lifting, standing, squatting, sleeping, stairs, reach over head, and locomotion level  PARTICIPATION LIMITATIONS: community activity and occupation  PERSONAL FACTORS: 1 comorbidity: BPPV  is also affecting patient's functional outcome.   REHAB POTENTIAL: Good  CLINICAL DECISION MAKING: Stable/uncomplicated  EVALUATION COMPLEXITY: Low   GOALS: Goals reviewed with patient? Yes  SHORT TERM GOALS: Target date: 12/27/2022  Niveen will be independent with her day 1 home exercise program Baseline: Started 11/29/2022 Goal status: On Going 12/06/2022  2.  Improve right shoulder active range of motion for flexion to 150 degrees, ER 70 degrees, IR to 45 degrees and horizontal adduction to 20 degrees Baseline: 130, 40, 20  and 15 respectively Goal status: On Going 12/06/2022  3.  Improve bilateral quadriceps strength to at least 75 pounds Baseline: Less than 70 left and less than 60 right Goal status: INITIAL   LONG TERM GOALS: Target date: 01/18/2023  Improve FOTO 65 in 11 visits Baseline: 48 Goal status: INITIAL  2.  Sukhmani will report right knee and shoulder pain consistently 0-2/10 on the numeric pain rating scale Baseline: 0-5/10 Goal status: INITIAL  3.  Improve right shoulder AROM to 90% or better as compared to the uninvolved left Baseline: See objective Goal status: INITIAL  4.  Improve right shoulder strength to 100% or better as compared to the uninvolved left Baseline: See objective Goal status: INITIAL  5.  Improve bilateral quadriceps strength as assessed by FOTO, objective measures and return to normal activities including stairs without limitation Baseline: Difficulty with stairs, other data available above Goal status: INITIAL  6.  Paulette will be independent with her long-term home exercise program at discharge Baseline: Started 11/29/2022 Goal status: INITIAL  PLAN:  PT FREQUENCY: 1-2x/week  PT DURATION: 8 weeks  PLANNED INTERVENTIONS: Therapeutic exercises, Therapeutic activity, Neuromuscular re-education, Balance training, Gait training, Patient/Family education, Self Care, Joint mobilization, Stair  training, Dry Needling, Cryotherapy, Moist heat, Vasopneumatic device, and Manual therapy  PLAN FOR NEXT SESSION: continue working on ROM for R shoulder, IR stretching with towel   Cassie Freer, PT, DPT 12/27/2022, 1:10 PM    PHYSICAL THERAPY DISCHARGE SUMMARY  Visits from Start of Care: 3  Current functional level related to goals / functional outcomes: See note   Remaining deficits: See note   Education / Equipment: HEP  Patient goals were not met. Patient is being discharged due to not returning since the last visit.  Chyrel Masson, PT, DPT, OCS,  ATC 02/21/23  2:44 PM

## 2022-12-27 ENCOUNTER — Encounter: Payer: BC Managed Care – PPO | Admitting: Rehabilitative and Restorative Service Providers"

## 2022-12-27 ENCOUNTER — Ambulatory Visit: Payer: BC Managed Care – PPO | Attending: Orthopaedic Surgery

## 2022-12-27 DIAGNOSIS — M25611 Stiffness of right shoulder, not elsewhere classified: Secondary | ICD-10-CM | POA: Insufficient documentation

## 2022-12-27 DIAGNOSIS — M25511 Pain in right shoulder: Secondary | ICD-10-CM | POA: Insufficient documentation

## 2022-12-27 DIAGNOSIS — M25561 Pain in right knee: Secondary | ICD-10-CM | POA: Diagnosis not present

## 2022-12-27 DIAGNOSIS — M6281 Muscle weakness (generalized): Secondary | ICD-10-CM | POA: Insufficient documentation

## 2022-12-28 ENCOUNTER — Encounter: Payer: BC Managed Care – PPO | Admitting: Rehabilitative and Restorative Service Providers"

## 2023-01-02 ENCOUNTER — Ambulatory Visit: Payer: BC Managed Care – PPO

## 2023-01-03 ENCOUNTER — Encounter: Payer: BC Managed Care – PPO | Admitting: Rehabilitative and Restorative Service Providers"

## 2023-01-03 DIAGNOSIS — Z0000 Encounter for general adult medical examination without abnormal findings: Secondary | ICD-10-CM | POA: Diagnosis not present

## 2023-01-04 ENCOUNTER — Encounter: Payer: BC Managed Care – PPO | Admitting: Rehabilitative and Restorative Service Providers"

## 2023-01-09 ENCOUNTER — Ambulatory Visit: Payer: BC Managed Care – PPO

## 2023-01-09 DIAGNOSIS — R051 Acute cough: Secondary | ICD-10-CM | POA: Diagnosis not present

## 2023-01-09 DIAGNOSIS — Z03818 Encounter for observation for suspected exposure to other biological agents ruled out: Secondary | ICD-10-CM | POA: Diagnosis not present

## 2023-01-09 DIAGNOSIS — J069 Acute upper respiratory infection, unspecified: Secondary | ICD-10-CM | POA: Diagnosis not present

## 2023-01-10 ENCOUNTER — Encounter: Payer: BC Managed Care – PPO | Admitting: Rehabilitative and Restorative Service Providers"

## 2023-01-11 ENCOUNTER — Encounter: Payer: BC Managed Care – PPO | Admitting: Rehabilitative and Restorative Service Providers"

## 2023-01-16 ENCOUNTER — Ambulatory Visit: Payer: BC Managed Care – PPO

## 2023-01-17 ENCOUNTER — Encounter: Payer: BC Managed Care – PPO | Admitting: Rehabilitative and Restorative Service Providers"

## 2023-01-18 ENCOUNTER — Encounter: Payer: BC Managed Care – PPO | Admitting: Rehabilitative and Restorative Service Providers"

## 2023-01-23 ENCOUNTER — Ambulatory Visit: Payer: BC Managed Care – PPO

## 2023-03-11 DIAGNOSIS — U071 COVID-19: Secondary | ICD-10-CM | POA: Diagnosis not present

## 2023-04-19 ENCOUNTER — Ambulatory Visit
Admission: RE | Admit: 2023-04-19 | Discharge: 2023-04-19 | Disposition: A | Discharge status: Home / Self Care | Payer: BC Managed Care – PPO | Source: Ambulatory Visit | Attending: Obstetrics and Gynecology | Admitting: Obstetrics and Gynecology

## 2023-04-19 DIAGNOSIS — N6489 Other specified disorders of breast: Secondary | ICD-10-CM

## 2023-04-19 DIAGNOSIS — N6325 Unspecified lump in the left breast, overlapping quadrants: Secondary | ICD-10-CM | POA: Diagnosis not present

## 2023-04-19 DIAGNOSIS — R928 Other abnormal and inconclusive findings on diagnostic imaging of breast: Secondary | ICD-10-CM

## 2023-04-20 ENCOUNTER — Other Ambulatory Visit: Payer: Self-pay | Admitting: Obstetrics and Gynecology

## 2023-04-20 DIAGNOSIS — N6320 Unspecified lump in the left breast, unspecified quadrant: Secondary | ICD-10-CM

## 2023-05-08 DIAGNOSIS — F4322 Adjustment disorder with anxiety: Secondary | ICD-10-CM | POA: Diagnosis not present

## 2023-05-09 DIAGNOSIS — F4322 Adjustment disorder with anxiety: Secondary | ICD-10-CM | POA: Diagnosis not present

## 2023-05-14 DIAGNOSIS — F332 Major depressive disorder, recurrent severe without psychotic features: Secondary | ICD-10-CM | POA: Diagnosis not present

## 2023-05-14 DIAGNOSIS — F900 Attention-deficit hyperactivity disorder, predominantly inattentive type: Secondary | ICD-10-CM | POA: Diagnosis not present

## 2023-05-14 DIAGNOSIS — F4312 Post-traumatic stress disorder, chronic: Secondary | ICD-10-CM | POA: Diagnosis not present

## 2023-05-15 DIAGNOSIS — F4322 Adjustment disorder with anxiety: Secondary | ICD-10-CM | POA: Diagnosis not present

## 2023-05-28 DIAGNOSIS — F4322 Adjustment disorder with anxiety: Secondary | ICD-10-CM | POA: Diagnosis not present

## 2023-07-08 DIAGNOSIS — F4322 Adjustment disorder with anxiety: Secondary | ICD-10-CM | POA: Diagnosis not present

## 2023-07-15 DIAGNOSIS — F4322 Adjustment disorder with anxiety: Secondary | ICD-10-CM | POA: Diagnosis not present

## 2023-07-23 DIAGNOSIS — F4322 Adjustment disorder with anxiety: Secondary | ICD-10-CM | POA: Diagnosis not present

## 2023-08-06 ENCOUNTER — Other Ambulatory Visit (INDEPENDENT_AMBULATORY_CARE_PROVIDER_SITE_OTHER)

## 2023-08-06 ENCOUNTER — Ambulatory Visit: Admitting: Orthopaedic Surgery

## 2023-08-06 DIAGNOSIS — M25561 Pain in right knee: Secondary | ICD-10-CM | POA: Diagnosis not present

## 2023-08-06 NOTE — Progress Notes (Signed)
 Office Visit Note   Patient: Cheryl Hanson           Date of Birth: 1980/12/31           MRN: [id number] Visit Date: 08/06/2023              Requested by: Belle Box, MD [address], [address] [place],  [place] [address] PCP: Belle Box, MD   Assessment & Plan: Visit Diagnoses:  1. Right knee pain, unspecified chronicity     Plan: History of Present Illness Cheryl Hanson is a 43 year old female who presents with right knee pain.  Knee pain occurs primarily when rising from a seated position and walking, especially after increased activity. The pain is located below the knee and worsens with pressure during standing, walking, and stair climbing. Resting for two days has not alleviated the pain, which persists when pushing off the knee while walking.  She has a history of frozen shoulder, currently improving, and performs beneficial physical therapy exercises at home. She experienced a stress fracture in her foot from excessive walking, accompanied by bruising. She tends to overexert when starting exercise routines and acknowledges the need for a gradual approach.  No pain is present upon flexing the knee.  Physical Exam MUSCULOSKELETAL: Pain in the tibial crest and tibial tubercle when getting up from a seated position.  No joint effusion. Normal motion.  No joint line tenderness.    Results RADIOLOGY Knee X-ray: Normal (08/03/2023)  Assessment and Plan Right patellar Tendonitis Pain localized to the patellar tendon, exacerbated by activities. Normal X-rays. Symptoms align with patellar tendonitis, possibly due to increased activity. - Provide knee strap for support. - Recommend anti-inflammatory ointment like Voltaren gel. - Advise oral NSAIDs like Advil  or Aleve. - Encourage activity moderation based on symptoms. - Suggest icing for relief. - Approve treadmill or seated bike if pain-free.  Follow-Up Instructions: No follow-ups on file.   Orders:  Orders  Placed This Encounter  Procedures   XR KNEE 3 VIEW RIGHT   No orders of the defined types were placed in this encounter.     Procedures: No procedures performed   Clinical Data: No additional findings.   Subjective: Chief Complaint  Patient presents with   Right Knee - Pain    HPI  Review of Systems  Constitutional: Negative.   HENT: Negative.    Eyes: Negative.   Respiratory: Negative.    Cardiovascular: Negative.   Endocrine: Negative.   Musculoskeletal: Negative.   Neurological: Negative.   Hematological: Negative.   Psychiatric/Behavioral: Negative.    All other systems reviewed and are negative.    Objective: Vital Signs: There were no vitals taken for this visit.  Physical Exam Vitals and nursing note reviewed.  Constitutional:      Appearance: She is well-developed.  HENT:     Head: Atraumatic.     Nose: Nose normal.  Eyes:     Extraocular Movements: Extraocular movements intact.  Cardiovascular:     Pulses: Normal pulses.  Pulmonary:     Effort: Pulmonary effort is normal.  Abdominal:     Palpations: Abdomen is soft.  Musculoskeletal:     Cervical back: Neck supple.  Skin:    General: Skin is warm.     Capillary Refill: Capillary refill takes less than 2 seconds.  Neurological:     Mental Status: She is alert. Mental status is at baseline.  Psychiatric:        Behavior:  Behavior normal.        Thought Content: Thought content normal.        Judgment: Judgment normal.     Ortho Exam  Specialty Comments:  No specialty comments available.  Imaging: XR KNEE 3 VIEW RIGHT Result Date: 08/06/2023 X-rays of the right knee show no acute or structural abnormalities    PMFS History: Patient Active Problem List   Diagnosis Date Noted   GAD (generalized anxiety disorder) 04/05/2015   Panic attacks 04/05/2015   Anxiety with somatization 04/05/2015   Cough 06/04/2012   Nystagmus 03/27/2010   GERD 03/27/2010   BENIGN POSITIONAL  VERTIGO 12/06/2009   TUBERCULOSIS, HX OF 10/25/2006   Past Medical History:  Diagnosis Date   BPV (benign positional vertigo)     Family History  Problem Relation Age of Onset   Throat cancer Father        non cigarette smoker   Hypertension Father        resolved post weight loss   Heart attack Maternal Grandfather        > 55   COPD Neg Hx    Diabetes Neg Hx    Stroke Neg Hx     Past Surgical History:  Procedure Laterality Date   TONSILLECTOMY AND ADENOIDECTOMY     UMBILICAL HERNIA REPAIR     Social History   Occupational History   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

## 2023-08-23 ENCOUNTER — Encounter (HOSPITAL_BASED_OUTPATIENT_CLINIC_OR_DEPARTMENT_OTHER): Payer: Self-pay

## 2023-08-23 ENCOUNTER — Emergency Department (HOSPITAL_BASED_OUTPATIENT_CLINIC_OR_DEPARTMENT_OTHER)
Admission: EM | Admit: 2023-08-23 | Discharge: 2023-08-23 | Disposition: A | Discharge status: Home / Self Care | Attending: Emergency Medicine | Admitting: Emergency Medicine

## 2023-08-23 ENCOUNTER — Emergency Department (HOSPITAL_BASED_OUTPATIENT_CLINIC_OR_DEPARTMENT_OTHER)

## 2023-08-23 DIAGNOSIS — M79661 Pain in right lower leg: Secondary | ICD-10-CM | POA: Diagnosis present

## 2023-08-23 DIAGNOSIS — M79651 Pain in right thigh: Secondary | ICD-10-CM | POA: Diagnosis not present

## 2023-08-23 LAB — CBC WITH DIFFERENTIAL/PLATELET
Abs Immature Granulocytes: 0.03 10*3/uL (ref 0.00–0.07)
Basophils Absolute: 0 10*3/uL (ref 0.0–0.1)
Basophils Relative: 0 %
Eosinophils Absolute: 0.1 10*3/uL (ref 0.0–0.5)
Eosinophils Relative: 1 %
HCT: 38.8 % (ref 36.0–46.0)
Hemoglobin: 12.8 g/dL (ref 12.0–15.0)
Immature Granulocytes: 0 %
Lymphocytes Relative: 33 %
Lymphs Abs: 3.1 10*3/uL (ref 0.7–4.0)
MCH: 28.5 pg (ref 26.0–34.0)
MCHC: 33 g/dL (ref 30.0–36.0)
MCV: 86.4 fL (ref 80.0–100.0)
Monocytes Absolute: 0.7 10*3/uL (ref 0.1–1.0)
Monocytes Relative: 7 %
Neutro Abs: 5.4 10*3/uL (ref 1.7–7.7)
Neutrophils Relative %: 59 %
Platelets: 209 10*3/uL (ref 150–400)
RBC: 4.49 MIL/uL (ref 3.87–5.11)
RDW: 13.1 % (ref 11.5–15.5)
WBC: 9.3 10*3/uL (ref 4.0–10.5)
nRBC: 0 % (ref 0.0–0.2)

## 2023-08-23 LAB — COMPREHENSIVE METABOLIC PANEL WITH GFR
ALT: 10 U/L (ref 0–44)
AST: 18 U/L (ref 15–41)
Albumin: 4.5 g/dL (ref 3.5–5.0)
Alkaline Phosphatase: 83 U/L (ref 38–126)
Anion gap: 11 (ref 5–15)
BUN: 17 mg/dL (ref 6–20)
CO2: 25 mmol/L (ref 22–32)
Calcium: 9.7 mg/dL (ref 8.9–10.3)
Chloride: 102 mmol/L (ref 98–111)
Creatinine, Ser: 0.99 mg/dL (ref 0.44–1.00)
GFR, Estimated: >60 mL/min (ref >60)
Glucose, Bld: 89 mg/dL (ref 70–99)
Potassium: 3.9 mmol/L (ref 3.5–5.1)
Sodium: 138 mmol/L (ref 135–145)
Total Bilirubin: <0.2 mg/dL (ref 0.0–1.2)
Total Protein: 7.5 g/dL (ref 6.5–8.1)

## 2023-08-23 NOTE — Discharge Instructions (Signed)
 You are seen in the emergency department today for concerns of leg pain.  Your ultrasound was negative for any signs of a blood clot called a DVT.  I suspect your pain is likely due to muscle weakness and instability.  Please follow-up with your primary care provider for further evaluation and possible physical therapy referral.  For any concerns of new or worsening symptoms, return to the emergency department.

## 2023-08-23 NOTE — ED Provider Notes (Signed)
  [place] EMERGENCY DEPARTMENT AT [facility] Provider Note   CSN: [id number] Arrival date & time: 08/23/23  1709     Patient presents with: Leg Pain (R)   Cheryl Hanson is a 43 y.o. female.  Patient presents to the emergency department with concerns of right leg pain and swelling off and on for the last several weeks.  She expresses concerns for a DVT but denies any prior history of PE or DVT.  Not on blood thinners.  Not on any OCP.  Denies any feelings of chest pain or shortness of breath.  Reports that the pain feels that has been migrating from the right anterolateral shin towards the posterior calf.  Also endorsing pain towards the right distal thigh.   Leg Pain      Prior to Admission medications   Not on File    Allergies: Patient has no known allergies.    Review of Systems  Updated Vital Signs BP 105/65 (BP Location: Right Arm)   Pulse 78   Temp 98.3 F (36.8 C)   Resp 16   SpO2 100%   Physical Exam  (all labs ordered are listed, but only abnormal results are displayed) Labs Reviewed  COMPREHENSIVE METABOLIC PANEL WITH GFR  CBC WITH DIFFERENTIAL/PLATELET    EKG: None  Radiology: US  Venous Img Lower Unilateral Right Result Date: 08/23/2023 CLINICAL DATA:  Right distal posterior thigh and posterior calf pain. EXAM: RIGHT LOWER EXTREMITY VENOUS DOPPLER ULTRASOUND TECHNIQUE: Gray-scale sonography with compression, as well as color and duplex ultrasound, were performed to evaluate the deep venous system(s) from the level of the common femoral vein through the popliteal and proximal calf veins. COMPARISON:  None Available. FINDINGS: VENOUS Normal compressibility of the common femoral, superficial femoral, and popliteal veins, as well as the visualized calf veins. Visualized portions of profunda femoral vein and great saphenous vein unremarkable. No filling defects to suggest DVT on grayscale or color Doppler imaging. Doppler waveforms show normal  direction of venous flow, normal respiratory plasticity and response to augmentation. Limited views of the contralateral common femoral vein are unremarkable. OTHER None. Limitations: none IMPRESSION: Negative. Electronically Signed   By: Harrietta Sherry M.D.   On: 08/23/2023 18:49    {Document cardiac monitor, telemetry assessment procedure when appropriate:32947} Procedures   Medications Ordered in the ED - No data to display    {Click here for ABCD2, HEART and other calculators REFRESH Note before signing:1}                              Medical Decision Making Amount and/or Complexity of Data Reviewed Labs: ordered.   ***  {Document critical care time when appropriate  Document review of labs and clinical decision tools ie CHADS2VASC2, etc  Document your independent review of radiology images and any outside records  Document your discussion with family members, caretakers and with consultants  Document social determinants of health affecting pt's care  Document your decision making why or why not admission, treatments were needed:32947:::1}   Final diagnoses:  Right calf pain    ED Discharge Orders     None

## 2023-08-23 NOTE — ED Triage Notes (Signed)
 Pt c/o RLE pain x last couple weeks. Denies swelling, redness, heat. Advises family history of DVT, sent by PCP for r/o

## 2023-10-22 ENCOUNTER — Ambulatory Visit
Admission: RE | Admit: 2023-10-22 | Discharge: 2023-10-22 | Disposition: A | Discharge status: Home / Self Care | Source: Ambulatory Visit | Attending: Obstetrics and Gynecology | Admitting: Obstetrics and Gynecology

## 2023-10-22 DIAGNOSIS — N6320 Unspecified lump in the left breast, unspecified quadrant: Secondary | ICD-10-CM

## 2023-12-30 ENCOUNTER — Encounter: Payer: Self-pay | Admitting: Radiology
